# Patient Record
Sex: Male | Born: 1965 | Race: White | Hispanic: No | Marital: Single | Smoking: Current every day smoker
Health system: Southern US, Community
[De-identification: ages and names within clinical notes are randomized; demographics above are authoritative.]

## PROBLEM LIST (undated history)

## (undated) DIAGNOSIS — I10 Essential (primary) hypertension: Secondary | ICD-10-CM

## (undated) DIAGNOSIS — M1990 Unspecified osteoarthritis, unspecified site: Secondary | ICD-10-CM

## (undated) DIAGNOSIS — K259 Gastric ulcer, unspecified as acute or chronic, without hemorrhage or perforation: Secondary | ICD-10-CM

## (undated) DIAGNOSIS — R51 Headache: Secondary | ICD-10-CM

## (undated) DIAGNOSIS — R519 Headache, unspecified: Secondary | ICD-10-CM

## (undated) DIAGNOSIS — K219 Gastro-esophageal reflux disease without esophagitis: Secondary | ICD-10-CM

## (undated) DIAGNOSIS — F32A Depression, unspecified: Secondary | ICD-10-CM

## (undated) DIAGNOSIS — G4730 Sleep apnea, unspecified: Secondary | ICD-10-CM

## (undated) DIAGNOSIS — E785 Hyperlipidemia, unspecified: Secondary | ICD-10-CM

## (undated) DIAGNOSIS — T148XXA Other injury of unspecified body region, initial encounter: Secondary | ICD-10-CM

## (undated) DIAGNOSIS — S129XXA Fracture of neck, unspecified, initial encounter: Secondary | ICD-10-CM

## (undated) DIAGNOSIS — M109 Gout, unspecified: Secondary | ICD-10-CM

## (undated) DIAGNOSIS — F329 Major depressive disorder, single episode, unspecified: Secondary | ICD-10-CM

## (undated) DIAGNOSIS — J449 Chronic obstructive pulmonary disease, unspecified: Secondary | ICD-10-CM

## (undated) DIAGNOSIS — L409 Psoriasis, unspecified: Secondary | ICD-10-CM

## (undated) HISTORY — PX: ABDOMINAL SURGERY: SHX537

## (undated) HISTORY — PX: HIP SURGERY: SHX245

## (undated) HISTORY — PX: INCISE AND DRAIN ABCESS: PRO64

## (undated) HISTORY — PX: FOOT AMPUTATION THROUGH ANKLE: SHX643

## (undated) HISTORY — PX: COLONOSCOPY W/ POLYPECTOMY: SHX1380

## (undated) HISTORY — PX: LUMBAR DISC SURGERY: SHX700

## (undated) HISTORY — PX: BACK SURGERY: SHX140

---

## 2003-02-25 ENCOUNTER — Encounter: Payer: Self-pay | Admitting: Emergency Medicine

## 2003-02-25 ENCOUNTER — Inpatient Hospital Stay (HOSPITAL_COMMUNITY): Admission: EM | Admit: 2003-02-25 | Discharge: 2003-02-26 | Payer: Self-pay | Admitting: Emergency Medicine

## 2003-11-22 ENCOUNTER — Inpatient Hospital Stay (HOSPITAL_COMMUNITY): Admission: EM | Admit: 2003-11-22 | Discharge: 2003-11-24 | Payer: Self-pay | Admitting: Emergency Medicine

## 2004-07-30 ENCOUNTER — Emergency Department (HOSPITAL_COMMUNITY): Admission: EM | Admit: 2004-07-30 | Discharge: 2004-07-30 | Payer: Self-pay | Admitting: Emergency Medicine

## 2004-08-03 ENCOUNTER — Ambulatory Visit (HOSPITAL_COMMUNITY): Admission: RE | Admit: 2004-08-03 | Discharge: 2004-08-03 | Payer: Self-pay | Admitting: Emergency Medicine

## 2004-08-03 ENCOUNTER — Encounter: Payer: Self-pay | Admitting: Family Medicine

## 2004-08-05 ENCOUNTER — Emergency Department: Payer: Self-pay | Admitting: General Practice

## 2004-08-23 ENCOUNTER — Ambulatory Visit: Payer: Self-pay | Admitting: Orthopedic Surgery

## 2004-09-24 ENCOUNTER — Inpatient Hospital Stay (HOSPITAL_COMMUNITY): Admission: RE | Admit: 2004-09-24 | Discharge: 2004-09-25 | Payer: Self-pay | Admitting: Neurosurgery

## 2006-01-12 ENCOUNTER — Inpatient Hospital Stay (HOSPITAL_COMMUNITY): Admission: EM | Admit: 2006-01-12 | Discharge: 2006-01-14 | Payer: Self-pay | Admitting: Emergency Medicine

## 2006-01-13 ENCOUNTER — Ambulatory Visit: Payer: Self-pay | Admitting: Internal Medicine

## 2006-01-22 ENCOUNTER — Ambulatory Visit: Payer: Self-pay | Admitting: Internal Medicine

## 2006-09-17 ENCOUNTER — Ambulatory Visit: Payer: Self-pay | Admitting: Gastroenterology

## 2006-10-14 ENCOUNTER — Emergency Department (HOSPITAL_COMMUNITY): Admission: EM | Admit: 2006-10-14 | Discharge: 2006-10-14 | Payer: Self-pay | Admitting: Emergency Medicine

## 2006-12-25 ENCOUNTER — Ambulatory Visit: Payer: Self-pay | Admitting: Family Medicine

## 2006-12-25 DIAGNOSIS — M109 Gout, unspecified: Secondary | ICD-10-CM

## 2006-12-25 DIAGNOSIS — F17200 Nicotine dependence, unspecified, uncomplicated: Secondary | ICD-10-CM | POA: Insufficient documentation

## 2006-12-25 DIAGNOSIS — M549 Dorsalgia, unspecified: Secondary | ICD-10-CM | POA: Insufficient documentation

## 2006-12-25 DIAGNOSIS — M1990 Unspecified osteoarthritis, unspecified site: Secondary | ICD-10-CM

## 2006-12-25 DIAGNOSIS — K219 Gastro-esophageal reflux disease without esophagitis: Secondary | ICD-10-CM | POA: Insufficient documentation

## 2006-12-25 DIAGNOSIS — J309 Allergic rhinitis, unspecified: Secondary | ICD-10-CM | POA: Insufficient documentation

## 2006-12-25 DIAGNOSIS — K279 Peptic ulcer, site unspecified, unspecified as acute or chronic, without hemorrhage or perforation: Secondary | ICD-10-CM | POA: Insufficient documentation

## 2006-12-26 ENCOUNTER — Encounter: Payer: Self-pay | Admitting: Family Medicine

## 2007-01-01 ENCOUNTER — Encounter: Payer: Self-pay | Admitting: Family Medicine

## 2007-01-02 ENCOUNTER — Ambulatory Visit: Payer: Self-pay | Admitting: Family Medicine

## 2007-01-05 LAB — CONVERTED CEMR LAB
ALT: 40 units/L (ref 0–40)
AST: 33 units/L (ref 0–37)
Albumin: 4 g/dL (ref 3.5–5.2)
Alkaline Phosphatase: 86 units/L (ref 39–117)
BUN: 7 mg/dL (ref 6–23)
Bilirubin, Direct: 0.1 mg/dL (ref 0.0–0.3)
CO2: 30 meq/L (ref 19–32)
Calcium: 9.6 mg/dL (ref 8.4–10.5)
Chloride: 105 meq/L (ref 96–112)
Cholesterol: 207 mg/dL (ref 0–200)
Creatinine, Ser: 1 mg/dL (ref 0.4–1.5)
Direct LDL: 91.8 mg/dL
GFR calc Af Amer: 105 mL/min
GFR calc non Af Amer: 87 mL/min
Glucose, Bld: 96 mg/dL (ref 70–99)
HDL: 54 mg/dL (ref >39.0)
Potassium: 4.3 meq/L (ref 3.5–5.1)
Sodium: 143 meq/L (ref 135–145)
Total Bilirubin: 0.6 mg/dL (ref 0.3–1.2)
Total CHOL/HDL Ratio: 3.8
Total Protein: 7.2 g/dL (ref 6.0–8.3)
Triglycerides: 404 mg/dL (ref 0–149)
VLDL: 81 mg/dL — ABNORMAL HIGH (ref 0–40)

## 2007-01-21 ENCOUNTER — Encounter: Payer: Self-pay | Admitting: Family Medicine

## 2007-03-24 ENCOUNTER — Ambulatory Visit: Payer: Self-pay | Admitting: Family Medicine

## 2007-03-30 ENCOUNTER — Encounter: Payer: Self-pay | Admitting: Family Medicine

## 2007-03-30 ENCOUNTER — Ambulatory Visit: Payer: Self-pay | Admitting: Family Medicine

## 2007-04-15 ENCOUNTER — Encounter: Payer: Self-pay | Admitting: Family Medicine

## 2007-05-12 ENCOUNTER — Encounter: Payer: Self-pay | Admitting: Family Medicine

## 2007-06-02 ENCOUNTER — Encounter: Payer: Self-pay | Admitting: Family Medicine

## 2008-03-03 ENCOUNTER — Emergency Department (HOSPITAL_COMMUNITY): Admission: EM | Admit: 2008-03-03 | Discharge: 2008-03-03 | Payer: Self-pay | Admitting: Emergency Medicine

## 2009-02-22 ENCOUNTER — Emergency Department: Payer: Self-pay | Admitting: Internal Medicine

## 2009-02-23 ENCOUNTER — Emergency Department: Payer: Self-pay | Admitting: Unknown Physician Specialty

## 2009-02-24 ENCOUNTER — Encounter (INDEPENDENT_AMBULATORY_CARE_PROVIDER_SITE_OTHER): Payer: Self-pay | Admitting: Orthopedic Surgery

## 2009-02-24 ENCOUNTER — Inpatient Hospital Stay (HOSPITAL_COMMUNITY): Admission: EM | Admit: 2009-02-24 | Discharge: 2009-02-28 | Payer: Self-pay | Admitting: Emergency Medicine

## 2009-11-20 ENCOUNTER — Emergency Department (HOSPITAL_COMMUNITY): Admission: EM | Admit: 2009-11-20 | Discharge: 2009-11-20 | Payer: Self-pay | Admitting: Emergency Medicine

## 2009-12-13 ENCOUNTER — Ambulatory Visit: Payer: Self-pay | Admitting: Internal Medicine

## 2009-12-13 DIAGNOSIS — R634 Abnormal weight loss: Secondary | ICD-10-CM | POA: Insufficient documentation

## 2009-12-13 DIAGNOSIS — R1013 Epigastric pain: Secondary | ICD-10-CM

## 2009-12-13 DIAGNOSIS — K210 Gastro-esophageal reflux disease with esophagitis: Secondary | ICD-10-CM

## 2009-12-13 DIAGNOSIS — Z8711 Personal history of peptic ulcer disease: Secondary | ICD-10-CM

## 2009-12-25 ENCOUNTER — Encounter (INDEPENDENT_AMBULATORY_CARE_PROVIDER_SITE_OTHER): Payer: Self-pay | Admitting: *Deleted
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
## 2010-09-06 NOTE — Letter (Signed)
Summary: Recall Radiology  [facility]  [address]   [place], [place] [address]   Phone: [phone]  Fax: [phone]    Dec 25, 2009  [name] [address] [place], [place]  [address] [birth year]   Dear [name],   Our office needs to get you scheduled for your procedure. Please give our office a call to schedule this.  You may call the office at your convenience at [phone].  Please ask for the Referral Coordinator to make arrangements for this to be scheduled.  You may have to leave a message on our voice mail.  We will return your call.  If for any reason you do not wish to schedule this, please advise the office.  Please do not neglect your health.   Thank you,    Ave Filter  [facility] Ph: [phone]   Fax: [phone]    Appended Document: Recall Radiology Letter returned, no valid address.

## 2010-10-23 LAB — DIFFERENTIAL
Basophils Absolute: 0 10*3/uL (ref 0.0–0.1)
Basophils Relative: 1 % (ref 0–1)
Eosinophils Absolute: 0.5 10*3/uL (ref 0.0–0.7)
Eosinophils Relative: 6 % — ABNORMAL HIGH (ref 0–5)
Lymphocytes Relative: 26 % (ref 12–46)
Lymphs Abs: 2.1 10*3/uL (ref 0.7–4.0)
Monocytes Absolute: 0.6 10*3/uL (ref 0.1–1.0)
Monocytes Relative: 8 % (ref 3–12)
Neutro Abs: 4.9 10*3/uL (ref 1.7–7.7)
Neutrophils Relative %: 60 % (ref 43–77)

## 2010-10-23 LAB — BASIC METABOLIC PANEL
BUN: 3 mg/dL — ABNORMAL LOW (ref 6–23)
CO2: 29 mEq/L (ref 19–32)
Calcium: 9.1 mg/dL (ref 8.4–10.5)
Chloride: 98 mEq/L (ref 96–112)
Creatinine, Ser: 0.81 mg/dL (ref 0.4–1.5)
GFR calc Af Amer: >60 mL/min (ref >60)
GFR calc non Af Amer: >60 mL/min (ref >60)
Glucose, Bld: 77 mg/dL (ref 70–99)
Potassium: 3.1 mEq/L — ABNORMAL LOW (ref 3.5–5.1)
Sodium: 134 mEq/L — ABNORMAL LOW (ref 135–145)

## 2010-10-23 LAB — CBC
HCT: 44.7 % (ref 39.0–52.0)
Hemoglobin: 15.5 g/dL (ref 13.0–17.0)
MCHC: 34.6 g/dL (ref 30.0–36.0)
MCV: 91.4 fL (ref 78.0–100.0)
Platelets: 281 10*3/uL (ref 150–400)
RBC: 4.88 MIL/uL (ref 4.22–5.81)
RDW: 13.1 % (ref 11.5–15.5)
WBC: 8.1 10*3/uL (ref 4.0–10.5)

## 2010-10-23 LAB — URIC ACID: Uric Acid, Serum: 6.9 mg/dL (ref 4.0–7.8)

## 2010-11-11 LAB — DIFFERENTIAL
Basophils Absolute: 0 10*3/uL (ref 0.0–0.1)
Basophils Relative: 0 % (ref 0–1)
Eosinophils Absolute: 0 10*3/uL (ref 0.0–0.7)
Eosinophils Absolute: 0.5 10*3/uL (ref 0.0–0.7)
Eosinophils Relative: 6 % — ABNORMAL HIGH (ref 0–5)
Lymphocytes Relative: 12 % (ref 12–46)
Lymphocytes Relative: 20 % (ref 12–46)
Lymphs Abs: 1 10*3/uL (ref 0.7–4.0)
Lymphs Abs: 1.6 10*3/uL (ref 0.7–4.0)
Monocytes Absolute: 0.8 10*3/uL (ref 0.1–1.0)
Monocytes Relative: 11 % (ref 3–12)
Monocytes Relative: 6 % (ref 3–12)
Neutro Abs: 4.9 10*3/uL (ref 1.7–7.7)
Neutrophils Relative %: 63 % (ref 43–77)
Neutrophils Relative %: 82 % — ABNORMAL HIGH (ref 43–77)

## 2010-11-11 LAB — CBC
HCT: 36.9 % — ABNORMAL LOW (ref 39.0–52.0)
HCT: 42.5 % (ref 39.0–52.0)
Hemoglobin: 14.5 g/dL (ref 13.0–17.0)
MCHC: 34.2 g/dL (ref 30.0–36.0)
MCV: 91 fL (ref 78.0–100.0)
MCV: 91.7 fL (ref 78.0–100.0)
Platelets: 244 10*3/uL (ref 150–400)
Platelets: 265 10*3/uL (ref 150–400)
RBC: 4.06 MIL/uL — ABNORMAL LOW (ref 4.22–5.81)
RBC: 4.63 MIL/uL (ref 4.22–5.81)
RDW: 13 % (ref 11.5–15.5)
WBC: 7.8 10*3/uL (ref 4.0–10.5)
WBC: 8.6 10*3/uL (ref 4.0–10.5)

## 2010-11-11 LAB — POCT I-STAT, CHEM 8
Calcium, Ion: 1.15 mmol/L (ref 1.12–1.32)
Chloride: 103 mEq/L (ref 96–112)
HCT: 45 % (ref 39.0–52.0)
Hemoglobin: 15.3 g/dL (ref 13.0–17.0)
TCO2: 25 mmol/L (ref 0–100)

## 2010-11-11 LAB — BASIC METABOLIC PANEL
BUN: 6 mg/dL (ref 6–23)
Calcium: 9.7 mg/dL (ref 8.4–10.5)
Chloride: 102 mEq/L (ref 96–112)
Creatinine, Ser: 0.81 mg/dL (ref 0.4–1.5)
Creatinine, Ser: 0.9 mg/dL (ref 0.4–1.5)
GFR calc Af Amer: >60 mL/min (ref >60)
GFR calc Af Amer: >60 mL/min (ref >60)
GFR calc non Af Amer: >60 mL/min (ref >60)
GFR calc non Af Amer: >60 mL/min (ref >60)
Potassium: 4.2 mEq/L (ref 3.5–5.1)

## 2010-11-11 LAB — ANAEROBIC CULTURE

## 2010-11-11 LAB — TISSUE CULTURE

## 2010-11-11 LAB — URIC ACID: Uric Acid, Serum: 7.4 mg/dL (ref 4.0–7.8)

## 2010-11-11 LAB — WOUND CULTURE

## 2010-11-11 LAB — GENTAMICIN LEVEL, TROUGH: Gentamicin Trough: 2.2 ug/mL (ref 0.5–2.0)

## 2010-12-18 NOTE — Op Note (Signed)
[name], [name]                 ACCOUNT NO.:  [id number]   MEDICAL RECORD NO.:  [id number]          PATIENT TYPE:  INP   LOCATION:  5032                         FACILITY:  [facility]   PHYSICIAN:  Dionne Ano. Gramig, M.D.DATE OF BIRTH:  [birth year]   DATE OF PROCEDURE:  02/26/2009  DATE OF DISCHARGE:                               OPERATIVE REPORT   PREOPERATIVE DIAGNOSIS:  Status post incision and drainage, deep  abscess, right elbow.  The patient presents for repeat incision and  drainage, possible loose closure.   POSTOPERATIVE DIAGNOSIS:  Status post incision and drainage, deep  abscess, right elbow.  The patient presents for repeat incision and  drainage, possible loose closure.   PROCEDURE:  Incision and drainage of skin, subcutaneous tissue, bone,  periosteum, and tendon, right elbow and meticulous complex closure over  a Hemovac drain, right elbow.   SURGEON:  Dionne Ano. Amanda Pea, MD   ASSISTANT:  None.   COMPLICATIONS:  None.   ANESTHESIA:  General.   TOURNIQUET TIME:  Zero.   INDICATIONS FOR PROCEDURE:  This patient is a 45 year old male who  presents with the above-mentioned diagnosis.  He was initially drained  and following the drainage 48 hours ago, the patient has improved  significantly in terms of resolving erythema.  He presents for repeat I  and D and possible wound closure.   OPERATIVE PROCEDURE:  The patient was seen by myself and the Anesthesia,  taken to the operative suite, time-out was called, preop checklist  accomplished.  He was prepped and draped in the usual sterile fashion.  Betadine scrub and paint about the affected right upper extremity.  Time-  out was then called once again and the operation commenced with I&D of  skin, subcutaneous tissue, bone, tendon, and periosteal tissue.  The  wound conditions were much improved.  No reaccumulation of fluid or pus  irrigated copiously and following this, I made a secondary survey to  make sure there was  no devitalized tissue.  Sharp knife dissection was  used for debridement purposes.  Following this, 3 L were placed through  the wound.  Once this done, Hemovac drain was placed and the wound was  then closed in a complex fashion.  This complex wound closure was about  the elbow.  I left proximal and distal openings to allow for the  egression of fluid.  Following this, compressive wrap was placed.  Topical bacitracin cream was placed and wound was dressed nicely.  Hemovac was hooked up for suction.  We will continue drainage,  close observation, and plan to have the patient continue in inpatient  status until his wound cultures are final.  He is currently on  vancomycin and gentamicin and we will continue him on these antibiotics  until cultures are final.  He tolerated the procedure well.  A splint  was placed.  He was taken to the recovery room in stable condition.      Dionne Ano. Amanda Pea, M.D.  Electronically Signed     Dionne Ano. Amanda Pea, M.D.  Electronically Signed    WMG/MEDQ  D:  02/26/2009  T:  02/26/2009  Job:  [id number]
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
## 2010-12-21 ENCOUNTER — Emergency Department: Payer: Self-pay | Admitting: Emergency Medicine

## 2010-12-21 NOTE — Op Note (Signed)
NAMETREVAUGHN, SCHEAR                 ACCOUNT NO.:  [id number]   MEDICAL RECORD NO.:  [id number]          PATIENT TYPE:  INP   LOCATION:  IC03                          FACILITY:  [facility]   PHYSICIAN:  R. Roetta Sessions, M.D. DATE OF BIRTH:  12/27/1965   DATE OF PROCEDURE:  01/13/2006  DATE OF DISCHARGE:                                 OPERATIVE REPORT   EGD bleeding control therapy.   INDICATIONS FOR PROCEDURE:  The patient is a 45 year old gentleman admitted  to the hospital with upper GI bleeding and he has remained hemodynamically  stable.  His hemoglobin has narrowed down to 10.9 without transfusion thus  far.  He does take aspirins in multiple forms including Alka-Seltzer.  EGD  is now being done.  This approach has been discussed with patient at length.  Potential risks, benefits and alternatives have been reviewed and questions  answered, agreeable.  NG tube was pulled out prior to procedure.  Cetacaine  Spray with topical oropharyngeal anesthesia.   CONSCIOUS SEDATION:  1.  Versed 5 mg IV.  2.  Demerol 125 mg IV in divided doses.   FINDINGS:  Examination of tubular esophagus revealed 3 cm four quadrant  erosions coming up from the EG junction.  He had a patulous EG junction.  There was no Barrett's, no active bleeding from the esophagus.  EG junction  easily traversed.  The remaining stomach, colon and gastric cavity was  emptied and insufflated well with air.  Thorough examination of gastric  mucosa including retroflexion of the proximal stomach and esophagogastric  junction demonstrated a small hiatal hernia and punctate hemorrhages,  erosions along the greater curvature consistent with NG tube trauma.  There  was a 6 mm prepyloric antral ulcer with a visible vessel present.  Please  see photos.  Pylorus was patent and easily traversed.  Examination of the  bulb and second portion revealed nodular duodenitis with erosions and edema  primarily involving the bulb, the second  portion appeared normal.   THERAPEUTIC DIAGNOSTIC MANEUVERS PERFORMED:  The bulbar ulcer of the visible  vessel was sealed with several applications of the Gold probe at 20 joules  each.  This was done without difficulty or apparent complication.  The  patient tolerated the procedure well and was reactive to endoscopy.   IMPRESSION:  1.  Moderately severe erosive reflux esophagitis with patulous      esophagogastric junction and small hiatal hernia, nasogastric tube      trauma as outlined above.  2.  Prepyloric gastric ulcer with bleeding stigmata thermally sealed as      described above, otherwise normal stomach.  3.  Marked inflammatory changes of the duodenal bulb, but no ulcer;      otherwise D1, D2 appeared normal.   RECOMMENDATIONS:  1.  Leave NG tube out, no nonsteroidals from here on out.  2.  Continue b.i.d. proton pump inhibitor therapy.  3.  Check H. pylori serologies.  4.  Advance to a clear liquid diet.      Jonathon Bellows, M.D.  Electronically Signed  RMR/MEDQ  D:  01/13/2006  T:  01/13/2006  Job:  [id number]   cc:   [employer] Hospitalist Team A
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
## 2010-12-21 NOTE — Procedures (Signed)
NAMESAHIB, PELLA                           ACCOUNT NO.:  [id number]   MEDICAL RECORD NO.:  [id number]                   PATIENT TYPE:  INP   LOCATION:  A203                                 FACILITY:  [facility]   PHYSICIAN:  Richard A. Alanda Amass, M.D.          DATE OF BIRTH:  1966-01-31   DATE OF PROCEDURE:  07/24/2004  DATE OF DISCHARGE:                                  ECHOCARDIOGRAM   INDICATION:  This 45 year old gentleman has a history of left ventricular  hypertrophy, hypertension, and several months of chest pain syndrome, and  possible cardiac murmur.   FINDINGS:  1. The aorta is normal at 3.6 cm.  2. The aortic valve has three leaflets with normal opening.  There is no AS     or AI.  3. The left atrium is normal at 3.5 cm.  4. The patient was in sinus rhythm during the study.  There were no clots     seen.  5. IVS and LVPW are mildly concentrically thickened to 1.3 and 1.2 cm     respectively.  This is compatible with mild concentric hypertrophy.     There is no outflow tract gradient, and there is normal septal and     posterior wall contraction pattern.  6. The left ventricular internal dimensions are within normal limits.  7. LVIDD equaled 4.2, LVISD equaled 3.0.  There is normal thickening of all     visualized myocardial segments with estimated ejection fraction of     approximately 60%.  8. LV inflow signal was normal with no evidence of diastolic dysfunction.  9. The right ventricle is slightly enlarged, but there is normal systolic     contraction.  There is no pericardial fusion, IVC collapses on     inspiration.  10.      The mitral valve opens normally.  There is mild TMR present.  No     mitral valve prolapse.  11.      There is trivial tricuspid regurgitation.  12.      The 2-D echocardiogram demonstrates normal systolic function.  No     evidence of diastolic relaxation abnormality.  13.      No significant valvular abnormality.  14.      Mild to moderate  concentric left ventricular hypertrophy is seen.      ___________________________________________                                            Pearletha Furl Alanda Amass, M.D.   RAW/MEDQ  D:  11/23/2003  T:  11/23/2003  Job:  [id number]   cc:   Hanley Hays. Dechurch, M.D.  [address]  [place]  [place] [address]  Fax: [phone]
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
## 2010-12-21 NOTE — Op Note (Signed)
NAMEDEIONTE, SPIVACK                 ACCOUNT NO.:  [id number]   MEDICAL RECORD NO.:  [id number]          PATIENT TYPE:  INP   LOCATION:  3030                         FACILITY:  [facility]   PHYSICIAN:  Donalee Citrin, M.D.        DATE OF BIRTH:  1966/04/07   DATE OF PROCEDURE:  09/24/2004  DATE OF DISCHARGE:                                 OPERATIVE REPORT   PREOPERATIVE DIAGNOSIS:  L5 radiculopathy from a large ruptured disc L4-L5,  left.   POSTOPERATIVE DIAGNOSIS:  L5 radiculopathy from a large ruptured disc L4-L5,  left.   PROCEDURE:  Lumbar laminectomy and microdiscectomy at L4-L5 on the left with  microscope dissection of the left L5 nerve root.   SURGEON:  Donalee Citrin, M.D.   ASSISTANT:  Kathaleen Maser. Pool, M.D.   ANESTHESIA:  General endotracheal anesthesia.   HISTORY OF PRESENT ILLNESS:  The patient is a very pleasant 45 year old  gentleman who has had long-standing back and left hip and leg pain going on  over the last several weeks and months.  It has been getting progressively  worse and the patient has failed all forms of conservative treatment.  Preoperative imaging showed a very large ruptured disc with free fragment  composites, severe spinal stenosis, and the patient's preoperative exam was  somewhat limited due to the patient has had a fused left ankle.  The right  leg was 5/5 strength.  Due to the large size of the disc fragments and  failure of conservative treatment, the patient was recommended laminectomy  and microdiscectomy.  I discussed the risks and benefits of the surgery with  him, he understood and agreed to proceed forth.   DESCRIPTION OF PROCEDURE:  The patient was brought to the operating room,  induced under general anesthesia, positioned prone on the Wilson frame.  The  back was prepped and draped in the usual sterile fashion.  After a preop x-  ray localized the L4-L5 disc space, a midline incision was made after  infiltration of 10 mL of lidocaine with  epinephrine, and bovie  electrocautery was used to dissect through the subcutaneous tissue and  subperiosteal dissection was carried out at the lamina of L4 and L5  confirmed by x-ray.  Then, the medial aspect of the facet complex and  inferior window of L4 was drilled down with a high speed drill and then 3 mm  Kerrison punches were used to remove the inferior aspect of L4 and medial  facet complex exposing the ligamentum flavum which was removed in a  piecemeal fashion exposing the thecal sac.  The ligament was again taken out  laterally under biting the lateral medial facet and gutter.  Then, the  operating microscope was draped and brought onto the field.  The L5 nerve  root was identified and L5 neural foramen was unroofed.  The L5 neural  foramen was noted to be markedly compressed from large fragment disc  displacing it dorsally.  Using a microdissection technique with a blunt  nerve hook, the L5 nerve root was mobilized, reflected medially.  An  annulotomy was made and several large fragments as well as a very large free  fragment was removed from the superior aspect of the disc space under the  thecal sac.  After all the free fragments were removed, the disc was cleaned  out with pituitary rongeurs and downgoing Epstein curets.  At the end of the  discectomy, there was no further stenosis of the thecal sac or the L5 nerve  roots, explored with a coronary dilator, angled hockey stick, and blunt  nerve hook and noted to have no further fragments appreciated.  Then, the  wound was copiously irrigated and meticulous hemostasis was maintained.  Gelfoam was overlaid on top of the dura.  The muscle and fascia were  reapproximated with 0 interrupted Vicryl, the  subcutaneous tissue was closed with 2-0 interrupted Vicryl, and skin was  closed with running 4-0 subcuticular.  Benzoin and Steri-Strips were  applied.  The patient went to the recovery room in stable condition.  At the  end of  the case, the needle, sponge, and instrument counts were correct.      GC/MEDQ  D:  09/24/2004  T:  09/24/2004  Job:  [id number]
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
## 2012-07-21 ENCOUNTER — Observation Stay (HOSPITAL_COMMUNITY)
Admission: EM | Admit: 2012-07-21 | Discharge: 2012-07-23 | Disposition: A | Discharge status: Home / Self Care | Payer: Medicare Other | Attending: Orthopedic Surgery | Admitting: Orthopedic Surgery

## 2012-07-21 ENCOUNTER — Emergency Department (HOSPITAL_COMMUNITY): Payer: Medicare Other

## 2012-07-21 ENCOUNTER — Encounter (HOSPITAL_COMMUNITY): Payer: Self-pay | Admitting: *Deleted

## 2012-07-21 DIAGNOSIS — R1013 Epigastric pain: Secondary | ICD-10-CM

## 2012-07-21 DIAGNOSIS — Z23 Encounter for immunization: Secondary | ICD-10-CM | POA: Insufficient documentation

## 2012-07-21 DIAGNOSIS — J309 Allergic rhinitis, unspecified: Secondary | ICD-10-CM

## 2012-07-21 DIAGNOSIS — M009 Pyogenic arthritis, unspecified: Secondary | ICD-10-CM

## 2012-07-21 DIAGNOSIS — I10 Essential (primary) hypertension: Secondary | ICD-10-CM | POA: Diagnosis present

## 2012-07-21 DIAGNOSIS — L03119 Cellulitis of unspecified part of limb: Secondary | ICD-10-CM | POA: Diagnosis present

## 2012-07-21 DIAGNOSIS — F1010 Alcohol abuse, uncomplicated: Secondary | ICD-10-CM | POA: Diagnosis present

## 2012-07-21 DIAGNOSIS — IMO0002 Reserved for concepts with insufficient information to code with codable children: Secondary | ICD-10-CM | POA: Insufficient documentation

## 2012-07-21 DIAGNOSIS — M25539 Pain in unspecified wrist: Secondary | ICD-10-CM | POA: Insufficient documentation

## 2012-07-21 DIAGNOSIS — F17200 Nicotine dependence, unspecified, uncomplicated: Secondary | ICD-10-CM | POA: Diagnosis present

## 2012-07-21 DIAGNOSIS — K279 Peptic ulcer, site unspecified, unspecified as acute or chronic, without hemorrhage or perforation: Secondary | ICD-10-CM | POA: Diagnosis present

## 2012-07-21 DIAGNOSIS — K219 Gastro-esophageal reflux disease without esophagitis: Secondary | ICD-10-CM | POA: Diagnosis present

## 2012-07-21 DIAGNOSIS — M109 Gout, unspecified: Principal | ICD-10-CM | POA: Diagnosis present

## 2012-07-21 HISTORY — DX: Gastric ulcer, unspecified as acute or chronic, without hemorrhage or perforation: K25.9

## 2012-07-21 HISTORY — DX: Essential (primary) hypertension: I10

## 2012-07-21 HISTORY — DX: Fracture of neck, unspecified, initial encounter: S12.9XXA

## 2012-07-21 HISTORY — DX: Hyperlipidemia, unspecified: E78.5

## 2012-07-21 HISTORY — DX: Headache: R51

## 2012-07-21 HISTORY — DX: Sleep apnea, unspecified: G47.30

## 2012-07-21 HISTORY — DX: Gout, unspecified: M10.9

## 2012-07-21 HISTORY — DX: Psoriasis, unspecified: L40.9

## 2012-07-21 HISTORY — DX: Unspecified osteoarthritis, unspecified site: M19.90

## 2012-07-21 HISTORY — DX: Gastro-esophageal reflux disease without esophagitis: K21.9

## 2012-07-21 HISTORY — DX: Headache, unspecified: R51.9

## 2012-07-21 LAB — CBC WITH DIFFERENTIAL/PLATELET
Basophils Relative: 1 % (ref 0–1)
HCT: 43.5 % (ref 39.0–52.0)
Hemoglobin: 15.4 g/dL (ref 13.0–17.0)
Lymphocytes Relative: 19 % (ref 12–46)
Lymphs Abs: 1.9 10*3/uL (ref 0.7–4.0)
MCHC: 35.4 g/dL (ref 30.0–36.0)
Monocytes Absolute: 1.2 10*3/uL — ABNORMAL HIGH (ref 0.1–1.0)
Monocytes Relative: 12 % (ref 3–12)
Neutro Abs: 6.5 10*3/uL (ref 1.7–7.7)
Neutrophils Relative %: 65 % (ref 43–77)
RBC: 4.9 MIL/uL (ref 4.22–5.81)

## 2012-07-21 LAB — COMPREHENSIVE METABOLIC PANEL
Albumin: 3.8 g/dL (ref 3.5–5.2)
Alkaline Phosphatase: 130 U/L — ABNORMAL HIGH (ref 39–117)
BUN: 7 mg/dL (ref 6–23)
CO2: 22 mEq/L (ref 19–32)
Chloride: 97 mEq/L (ref 96–112)
Creatinine, Ser: 0.82 mg/dL (ref 0.50–1.35)
GFR calc non Af Amer: >90 mL/min (ref >90)
Glucose, Bld: 125 mg/dL — ABNORMAL HIGH (ref 70–99)
Potassium: 3.8 mEq/L (ref 3.5–5.1)
Total Bilirubin: 1 mg/dL (ref 0.3–1.2)

## 2012-07-21 LAB — URIC ACID: Uric Acid, Serum: 11.7 mg/dL — ABNORMAL HIGH (ref 4.0–7.8)

## 2012-07-21 MED ORDER — INFLUENZA VIRUS VACC SPLIT PF IM SUSP
0.5000 mL | INTRAMUSCULAR | Status: AC
  Administered 2012-07-22: 0.5 mL via INTRAMUSCULAR
  Filled 2012-07-21: qty 0.5

## 2012-07-21 MED ORDER — MORPHINE SULFATE 4 MG/ML IJ SOLN
4.0000 mg | Freq: Once | INTRAMUSCULAR | Status: AC
  Administered 2012-07-21: 4 mg via INTRAVENOUS
  Filled 2012-07-21: qty 1

## 2012-07-21 MED ORDER — THIAMINE HCL 100 MG/ML IJ SOLN
100.0000 mg | Freq: Every day | INTRAMUSCULAR | Status: DC
  Administered 2012-07-21: 100 mg via INTRAVENOUS
  Filled 2012-07-21 (×3): qty 1

## 2012-07-21 MED ORDER — ADULT MULTIVITAMIN W/MINERALS CH
1.0000 | ORAL_TABLET | Freq: Every day | ORAL | Status: DC
  Administered 2012-07-21 – 2012-07-23 (×3): 1 via ORAL
  Filled 2012-07-21 (×3): qty 1

## 2012-07-21 MED ORDER — VITAMIN B-1 100 MG PO TABS
100.0000 mg | ORAL_TABLET | Freq: Every day | ORAL | Status: DC
  Administered 2012-07-22 – 2012-07-23 (×2): 100 mg via ORAL
  Filled 2012-07-21 (×3): qty 1

## 2012-07-21 MED ORDER — MORPHINE SULFATE 2 MG/ML IJ SOLN
1.0000 mg | INTRAMUSCULAR | Status: DC | PRN
  Administered 2012-07-21 – 2012-07-22 (×6): 1 mg via INTRAVENOUS
  Filled 2012-07-21 (×5): qty 1

## 2012-07-21 MED ORDER — PANTOPRAZOLE SODIUM 40 MG PO TBEC
40.0000 mg | DELAYED_RELEASE_TABLET | Freq: Every day | ORAL | Status: DC
  Administered 2012-07-21 – 2012-07-23 (×3): 40 mg via ORAL
  Filled 2012-07-21 (×3): qty 1

## 2012-07-21 MED ORDER — PNEUMOCOCCAL VAC POLYVALENT 25 MCG/0.5ML IJ INJ
0.5000 mL | INJECTION | INTRAMUSCULAR | Status: AC
  Administered 2012-07-22: 0.5 mL via INTRAMUSCULAR
  Filled 2012-07-21: qty 0.5

## 2012-07-21 MED ORDER — LORAZEPAM 2 MG/ML IJ SOLN: 1.0000 mg | Freq: Four times a day (QID) | INTRAMUSCULAR | Status: DC | PRN

## 2012-07-21 MED ORDER — AMLODIPINE BESYLATE 5 MG PO TABS
5.0000 mg | ORAL_TABLET | Freq: Every day | ORAL | Status: DC
  Administered 2012-07-21 – 2012-07-23 (×3): 5 mg via ORAL
  Filled 2012-07-21 (×3): qty 1

## 2012-07-21 MED ORDER — VANCOMYCIN HCL IN DEXTROSE 1-5 GM/200ML-% IV SOLN
1000.0000 mg | Freq: Three times a day (TID) | INTRAVENOUS | Status: DC
  Administered 2012-07-21 – 2012-07-23 (×6): 1000 mg via INTRAVENOUS
  Filled 2012-07-21 (×8): qty 200

## 2012-07-21 MED ORDER — ONDANSETRON HCL 4 MG PO TABS: 4.0000 mg | ORAL_TABLET | Freq: Four times a day (QID) | ORAL | Status: DC | PRN

## 2012-07-21 MED ORDER — COLCHICINE 0.6 MG PO TABS
0.6000 mg | ORAL_TABLET | Freq: Three times a day (TID) | ORAL | Status: DC
  Administered 2012-07-21 – 2012-07-23 (×6): 0.6 mg via ORAL
  Filled 2012-07-21 (×8): qty 1

## 2012-07-21 MED ORDER — KETOROLAC TROMETHAMINE 30 MG/ML IJ SOLN
30.0000 mg | Freq: Once | INTRAMUSCULAR | Status: AC
  Administered 2012-07-21: 30 mg via INTRAVENOUS
  Filled 2012-07-21: qty 1

## 2012-07-21 MED ORDER — FOLIC ACID 1 MG PO TABS
1.0000 mg | ORAL_TABLET | Freq: Every day | ORAL | Status: DC
  Administered 2012-07-21 – 2012-07-23 (×3): 1 mg via ORAL
  Filled 2012-07-21 (×3): qty 1

## 2012-07-21 MED ORDER — SODIUM CHLORIDE 0.9 % IV BOLUS (SEPSIS)
1000.0000 mL | Freq: Once | INTRAVENOUS | Status: AC
  Administered 2012-07-21: 1000 mL via INTRAVENOUS

## 2012-07-21 MED ORDER — SODIUM CHLORIDE 0.45 % IV SOLN
INTRAVENOUS | Status: DC
  Administered 2012-07-21 – 2012-07-23 (×5): via INTRAVENOUS

## 2012-07-21 MED ORDER — ONDANSETRON HCL 4 MG/2ML IJ SOLN
4.0000 mg | Freq: Four times a day (QID) | INTRAMUSCULAR | Status: DC | PRN
  Filled 2012-07-21: qty 2

## 2012-07-21 MED ORDER — LORAZEPAM 1 MG PO TABS
1.0000 mg | ORAL_TABLET | Freq: Four times a day (QID) | ORAL | Status: DC | PRN
Start: 2012-07-21 — End: 2012-07-23
  Administered 2012-07-22 – 2012-07-23 (×2): 1 mg via ORAL
  Filled 2012-07-21 (×2): qty 1

## 2012-07-21 NOTE — Progress Notes (Signed)
Patient ID: [name], male   DOB: 1965/10/12, 46 y.o.   MRN: [id number] Seen and examined  Will watch closely and begin colchicine No definable abscess but will watch for declaration/worsening Dominica Severin MD

## 2012-07-21 NOTE — H&P (Signed)
[name] is an 46 y.o. male.   Chief Complaint: Right elbow and wrist pain HPI: The patient is a 46 year old gentleman who presents to the hospital today for evaluation of his right upper extremity. He states that he's had approximately a one-week history of right elbow pain and now right wrist pain. He states this increased over the weekend and as of yesterday he noted significant swelling pain and difficulty bending the elbow and wrist. He denies any antecedent trauma. He has been seen and evaluated by our services in the past with a history of gout and olecranon bursitis, infectious in nature. He underwent a prior I&D in 2010 about the elbow, tophaceous deposits were noted at the time cultures did not show any significant growth. The patient states he did have an increased amount of alcohol use over the weekend and beginning this week had noted increase in symptoms about the elbow and wrist. He denies fevers, chills, shortness of breath. His had no treatment his current predicament.  He has been seen and evaluated in the past at the [facility]  for gout management, peptic ulcer disease, reflux, and back pain.  Past medical history: Gout, tobacco abuse, allergic rhinitis, hypertension untreated, reflux esophagitis, GERD, peptic ulcer disease, osteoarthritis, back pain, history of alcohol abuse (chart reviewed).    Past Surgical History  Procedure Date  . Hip surgery   . Abdominal surgery   . Elbow surgery     right  . Foot surgery   . Back surgery     No family history on file. Social History:  reports that he has been smoking.  He does not have any smokeless tobacco history on file. He reports that he drinks alcohol. He reports that he does not use illicit drugs.  Allergies:  Allergies  Allergen Reactions  . Penicillins     REACTION: Rash, itching     (Not in a hospital admission)  Results for orders placed during the hospital encounter of 07/21/12 (from the past 48  hour(s))  CBC WITH DIFFERENTIAL     Status: Abnormal   Collection Time   07/21/12  8:52 AM      Component Value Range Comment   WBC 10.0  4.0 - 10.5 K/uL    RBC 4.90  4.22 - 5.81 MIL/uL    Hemoglobin 15.4  13.0 - 17.0 g/dL    HCT 29.5  28.4 - 13.2 %    MCV 88.8  78.0 - 100.0 fL    MCH 31.4  26.0 - 34.0 pg    MCHC 35.4  30.0 - 36.0 g/dL    RDW 44.0  10.2 - 72.5 %    Platelets 197  150 - 400 K/uL    Neutrophils Relative 65  43 - 77 %    Neutro Abs 6.5  1.7 - 7.7 K/uL    Lymphocytes Relative 19  12 - 46 %    Lymphs Abs 1.9  0.7 - 4.0 K/uL    Monocytes Relative 12  3 - 12 %    Monocytes Absolute 1.2 (*) 0.1 - 1.0 K/uL    Eosinophils Relative 3  0 - 5 %    Eosinophils Absolute 0.3  0.0 - 0.7 K/uL    Basophils Relative 1  0 - 1 %    Basophils Absolute 0.1  0.0 - 0.1 K/uL   COMPREHENSIVE METABOLIC PANEL     Status: Abnormal   Collection Time   07/21/12  8:52 AM  Component Value Range Comment   Sodium 136  135 - 145 mEq/L    Potassium 3.8  3.5 - 5.1 mEq/L    Chloride 97  96 - 112 mEq/L    CO2 22  19 - 32 mEq/L    Glucose, Bld 125 (*) 70 - 99 mg/dL    BUN 7  6 - 23 mg/dL    Creatinine, Ser 4.54  0.50 - 1.35 mg/dL    Calcium 9.6  8.4 - 09.8 mg/dL    Total Protein 8.1  6.0 - 8.3 g/dL    Albumin 3.8  3.5 - 5.2 g/dL    AST 52 (*) 0 - 37 U/L    ALT 59 (*) 0 - 53 U/L    Alkaline Phosphatase 130 (*) 39 - 117 U/L    Total Bilirubin 1.0  0.3 - 1.2 mg/dL    GFR calc non Af Amer >90  >90 mL/min    GFR calc Af Amer >90  >90 mL/min   URIC ACID     Status: Abnormal   Collection Time   07/21/12  8:52 AM      Component Value Range Comment   Uric Acid, Serum 11.7 (*) 4.0 - 7.8 mg/dL    Dg Elbow Complete Right  07/21/2012  *RADIOLOGY REPORT*  Clinical Data: Swelling and redness about the posterior aspect of the elbow.  RIGHT ELBOW - COMPLETE 3+ VIEW  Comparison: Plain films 02/24/2009.  Findings: Soft tissues over the olecranon appear swollen.  No fracture, dislocation or joint effusion  is identified. No radiopaque foreign body or soft tissue gas collection is seen.  IMPRESSION: Soft tissue swelling over the olecranon compatible with olecranon bursitis.  No acute bony abnormality.   Original Report Authenticated By: Holley Dexter, M.D.    Dg Wrist Complete Right  07/21/2012  *RADIOLOGY REPORT*  Clinical Data: Arm swelling  RIGHT WRIST - COMPLETE 3+ VIEW  Comparison: 11/20/2009  Findings: Four views of the right wrist submitted.  No acute fracture or subluxation.  No radiopaque foreign body.  IMPRESSION: No acute fracture or subluxation.   Original Report Authenticated By: Natasha Mead, M.D.     Review of Systems  Constitutional: Negative.   HENT: Negative.   Eyes: Negative.   Respiratory: Negative.   Cardiovascular: Negative.   Gastrointestinal: Positive for heartburn.  Genitourinary: Negative.   Musculoskeletal:       See history of present illness  Skin: Negative.   Neurological: Negative.   Endo/Heme/Allergies: Negative.     Blood pressure 143/100, pulse 74, temperature 98.2 F (36.8 C), temperature source Oral, resp. rate 20, SpO2 98.00%. Physical Exam  The patient is pleasant in no acute distress, cooperative. Head: atraumatic Chest ; equal expansions presents, respirations are non-labored Right upper extremity: The patient has notable swelling about the posterior elbow with overlying erythema present he is exquisitely tender to light touch and with any attempts at active or passive range of motion about the elbow. He has no obvious purulent drainage but does have 1 area of eschar about the olecranon process which he states has been there for a long time. The surrounding soft tissue swelling about the elbow but does not have a significantly large and boggy bursa at this juncture. Evaluation of the dorsal aspect of the wrist shows he has an approximately and 3.5 x 3 and half centimeter region of erythema and swelling which is exquisitely tender to touch in addition  he has significant pain with attempts at wrist range of  motion both active and passive, neurovascularly he is intact to the upper  Lab are reviewed the patient is currently afebrile he does not have significant leukocytosis but does have an elevated uric acid level of 11.7 in addition he has increased hepatic function present Assessment/Plan Right elbow and wrist gouty exacerbation with the involving cellulitis and hospital infectious bursitis right elbow. History of uncontrolled hypertension History of alcohol abuse History of peptic ulcer History of GERD History of allergic rhinitis History of tobacco abuse History of gout About a lengthy discussion with the patient at concerns in regards to his upper extremity, at this juncture we are going to make him n.p.o. he will need admitted to the floor for IV antibiotics in the form of vancomycin, given his penicillin allergy in addition will begin medical management of gout with cultures seen. We are going to observe him closely he understands certainly we may have to perform an I&D/operative intervention to the elbow but this juncture with an admitting IV antibiotics close observation And treatment of gout. Hospitalist will be consulted  given his multiple medical comorbidities and uncontrolled hypertension.   Dao Memmott L 07/21/2012, 12:08 PM

## 2012-07-21 NOTE — ED Notes (Signed)
Pt is here with right elbow swelling and redness/  Pt reports severe pain.  Pt has redness that started in right elbow and has spread down to fingers.  Pulse present.  Started 4 days ago

## 2012-07-21 NOTE — Progress Notes (Signed)
ANTIBIOTIC CONSULT NOTE - INITIAL  Pharmacy Consult for Vancomycin  Indication: cellulitis vs infectious bursitis   Allergies  Allergen Reactions  . Penicillins     REACTION: Rash, itching    Patient Measurements: Height: 6' (182.9 cm) Weight: 200 lb (90.719 kg) IBW/kg (Calculated) : 77.6   Vital Signs: Temp: 98.2 F (36.8 C) (12/17 0816) Temp src: Oral (12/17 0816) BP: 143/100 mmHg (12/17 1015) Pulse Rate: 74  (12/17 1015) Intake/Output from previous day:   Intake/Output from this shift:    Labs:  [facility] 07/21/12 0852  WBC 10.0  HGB 15.4  PLT 197  LABCREA --  CREATININE 0.82   Estimated Creatinine Clearance: 120.9 ml/min (by C-G formula based on Cr of 0.82). No results found for this basename: VANCOTROUGH:2,VANCOPEAK:2,VANCORANDOM:2,GENTTROUGH:2,GENTPEAK:2,GENTRANDOM:2,TOBRATROUGH:2,TOBRAPEAK:2,TOBRARND:2,AMIKACINPEAK:2,AMIKACINTROU:2,AMIKACIN:2, in the last 72 hours   Microbiology: No results found for this or any previous visit (from the past 720 hour(s)).  Medical History: History reviewed. No pertinent past medical history.  Assessment: [name] is a 46 year old male presenting to [facility] on 12/17 with right elbow and wrist gouty exacerbation with cellulitis and ? infectious bursitis to start vancomycin. Noted rash to penicillins. Currently patient is afebrile and wbc count within normal limits. No cultures drawn thus far. Patient will good renal function (crcl >133ml/min). No abx given in the ED.    Goal of Therapy:  Vancomycin trough level 10-15 mcg/ml  Plan:  Begin vancomcyin 1000mg  IV q8h F/u renal function, cultures drawn, and troughs  Thank you,  Brett Fairy, PharmD, BCPS 07/21/2012 1:14 PM   Brett Fairy N 07/21/2012,1:11 PM

## 2012-07-21 NOTE — Consult Note (Signed)
[facility] Medical Consultation  [name] ZOX:[id number] DOB: [birth year] DOA: 07/21/2012 PCP: Kerby Nora, MD   Requesting physician: Dr. Dominica Severin  Date of consultation: 07/21/2012  Reason for consultation: Evaluation and management of uncontrolled HTN.  Impression/Recommendations Principal Problem:  *Gout flare Active Problems:  TOBACCO USER  GERD  PEPTIC ULCER DISEASE  HTN (hypertension)  Alcohol abuse  Cellulitis of elbow   Uncontrolled hypertension  Remotely patient was on blood pressure medications but cannot recollect name. Has not been on any medications for quite a while.  Has not recently checked blood pressures.  Blood pressure as high as 167/112 mmHg in the ED-some of this may be precipitated by pain.  We'll start amlodipine 5 mg by mouth daily and monitor.  Patient counseled regarding compliance with medications and M.D. followups. He verbalized understanding.  Gouty exacerbation of right elbow and wrist with possible cellulitis complicating right elbow. Rule out infected bursitis.  Management per primary team.  Plans for oral colchicine, IV vancomycin and reevaluation in a few hours-if no improvement then possible I&D.  Pain management.  Consider allopurinol when acute phase has completely resolved.  Tobacco abuse  Cessation counseled.   Patient declines a nicotine patch.   Alcohol abuse  Place on Ativan protocol. Monitor.  Mild transaminitis, likely secondary to this.  History of perforated gastric ulcer/GERD/ongoing aspirin use  Place on PPIs.  No ongoing symptoms.  Advised to avoid ASA/NSAIDS.  History of psoriasis   The [facility] service will followup again tomorrow. Please contact us if we can be of assistance in the meanwhile. Thank you for this consultation.  Chief Complaint: Right elbow and wrist pain.  HPI:  46 year old male patient with history of untreated HTN, HL, psoriasis, Gout and prior I&D of  right elbow for same, possible perforated stomach ulcer related to NSAID use, tobacco and alcohol abuse presented to the ED on 12/17 due worsening the right elbow and wrist pain and swelling. He first noticed right elbow pain approximately a week ago. The next day this was associated with swelling and pain and swelling in the right wrist. She denies fever or chills. No history of trauma. He tried to control his symptoms over the next several days with over-the-counter aspirin. However the symptoms continued to progressively get worse. She rates pain as severe, nonradiating, worse with movement of the joints, associated with some redness of his right elbow, minimum relief with aspirin. He presented to the emergency department and was evaluated by the orthopods who suspect gouty flare of both the joints and possible super added cellulitis of his right elbow. They plan close observation for next several hours and if no improvement in possible I&D for infection.  Review of Systems:  All systems reviewed and apart from history of presenting illness, negative.  Past Medical History  Diagnosis Date  . HTN (hypertension)   . Hyperlipidemia   . Gout   . Psoriasis   . H/O gastroesophageal reflux (GERD)    Past Surgical History  Procedure Date  . Hip surgery     right  . Abdominal surgery     ?perforated stomach ulcer  . Elbow surgery     right  . Foot surgery     left  . Back surgery     Twice   Social History:  reports that he has been smoking.  He does not have any smokeless tobacco history on file. He reports that he drinks alcohol. He reports that he does not use  illicit drugs. Patient smokes half a pack of cigarettes per day. He drinks a 12 pack of beers with his friends on weekends and also does 2-3 beers every other day. Occasionally consumes hard liquor. Denies drug abuse. Single and independent of activities of daily living.  Allergies  Allergen Reactions  . Penicillins     REACTION:  Rash, itching   Family History  Problem Relation Age of Onset  . Diabetes Mother   . Stroke Brother   . Hypertension Father     Prior to Admission medications   Medication Sig Start Date End Date Taking? Authorizing Provider  aspirin 500 MG EC tablet Take 500 mg by mouth every 6 (six) hours as needed. For pain   Yes Historical Provider, MD   Physical Exam: Blood pressure 133/94, pulse 64, temperature 98.2 F (36.8 C), temperature source Oral, resp. rate 20, height 6' (1.829 m), weight 90.719 kg (200 lb), SpO2 98.00%. Filed Vitals:   07/21/12 1015 07/21/12 1300 07/21/12 1306 07/21/12 1330  BP: 143/100   133/94  Pulse: 74   64  Temp:      TempSrc:      Resp:      Height:  6' (1.829 m) 6' (1.829 m)   Weight:  90.719 kg (200 lb) 90.719 kg (200 lb)   SpO2: 98%   98%     General exam: Moderately built and nourished male patient, lying comfortably on the gurney and in no obvious distress.  Head, eyes and ENT: Nontraumatic and normocephalic. Pupils equally reacting to light and accommodation. Oral mucosa moist.  Neck: Supple. No JVD, carotid bruit or thyromegaly.  Lymphatics: No lymphadenopathy.  Respiratory system: Clear to auscultation. No increased work of breathing.  Cardiovascular system: First and second heart sounds heard, regular rate and rhythm. No JVD, murmurs or gallops. No pedal edema.  Gastrointestinal system: Abdomen is nondistended, soft and nontender. No organomegaly or masses appreciated. Normal bowel sounds heard.  Central nervous system: Alert and oriented. No focal neurological deficits.  Extremities: Right upper extremity: Right elbow and wrist have been wrapped by primary service and unable to examine at this time. Able to freely move fingers and normal color. Left upper extremity and bilateral lower extremity grade 5/5 power. Peripheral pulses symmetrically felt.  Skin: No acute findings. Patient has small superficial psoriatic rash on left lower  shin.  Musculoskeletal system: Lower midline lumbar surgical scar. Otherwise negative.  Psychiatry: Pleasant and cooperative. Not anxious or tremulous.   Labs on Admission:  Basic Metabolic Panel:  Lab 07/21/12 0865  NA 136  K 3.8  CL 97  CO2 22  GLUCOSE 125*  BUN 7  CREATININE 0.82  CALCIUM 9.6  MG --  PHOS --   Liver Function Tests:  Lab 07/21/12 0852  AST 52*  ALT 59*  ALKPHOS 130*  BILITOT 1.0  PROT 8.1  ALBUMIN 3.8   No results found for this basename: LIPASE:5,AMYLASE:5 in the last 168 hours No results found for this basename: AMMONIA:5 in the last 168 hours CBC:  Lab 07/21/12 0852  WBC 10.0  NEUTROABS 6.5  HGB 15.4  HCT 43.5  MCV 88.8  PLT 197   Cardiac Enzymes: No results found for this basename: CKTOTAL:5,CKMB:5,CKMBINDEX:5,TROPONINI:5 in the last 168 hours BNP: No components found with this basename: POCBNP:5 CBG: No results found for this basename: GLUCAP:5 in the last 168 hours  Radiological Exams on Admission: Dg Elbow Complete Right  07/21/2012  *RADIOLOGY REPORT*  Clinical Data: Swelling and redness  about the posterior aspect of the elbow.  RIGHT ELBOW - COMPLETE 3+ VIEW  Comparison: Plain films 02/24/2009.  Findings: Soft tissues over the olecranon appear swollen.  No fracture, dislocation or joint effusion is identified. No radiopaque foreign body or soft tissue gas collection is seen.  IMPRESSION: Soft tissue swelling over the olecranon compatible with olecranon bursitis.  No acute bony abnormality.   Original Report Authenticated By: Holley Dexter, M.D.    Dg Wrist Complete Right  07/21/2012  *RADIOLOGY REPORT*  Clinical Data: Arm swelling  RIGHT WRIST - COMPLETE 3+ VIEW  Comparison: 11/20/2009  Findings: Four views of the right wrist submitted.  No acute fracture or subluxation.  No radiopaque foreign body.  IMPRESSION: No acute fracture or subluxation.   Original Report Authenticated By: Natasha Mead, M.D.     Time spent: 50  minutes  [facility] Pager [phone]  If 7PM-7AM, please contact night-coverage [email or web address] Password [email or web address] 07/21/2012, 2:13 PM

## 2012-07-21 NOTE — ED Provider Notes (Signed)
History     CSN: [id number]  Arrival date & time 07/21/12  0804   First MD Initiated Contact with Patient 07/21/12 [phone]      Chief Complaint  Patient presents with  . Arm Swelling    (Consider location/radiation/quality/duration/timing/severity/associated sxs/prior treatment) HPI Pt present with 4 days of R elbow pain, redness, warmth and decreased mobility. No history of trauma. Pt has also had several days of R wrist pain with swelling and redness. No fever or chills. Joints are tender to light touch. Pt had bursitis that needed drainage 3 years ago in same elbow.  History reviewed. No pertinent past medical history.  Past Surgical History  Procedure Date  . Hip surgery   . Abdominal surgery   . Elbow surgery     right  . Foot surgery   . Back surgery     No family history on file.  History  Substance Use Topics  . Smoking status: Current Every Day Smoker  . Smokeless tobacco: Not on file  . Alcohol Use: Yes     Comment: occ      Review of Systems  Constitutional: Negative for fever, chills and fatigue.  Musculoskeletal: Positive for arthralgias. Negative for myalgias.  Skin: Positive for color change. Negative for wound.  All other systems reviewed and are negative.    Allergies  Penicillins  Home Medications   Current Outpatient Rx  Name  Route  Sig  Dispense  Refill  . ASPIRIN 500 MG PO TBEC   Oral   Take 500 mg by mouth every 6 (six) hours as needed. For pain           BP 133/94  Pulse 64  Temp 98.2 F (36.8 C) (Oral)  Resp 20  Ht 6' (1.829 m)  Wt 200 lb (90.719 kg)  BMI 27.12 kg/m2  SpO2 98%  Physical Exam  Nursing note and vitals reviewed. Constitutional: He is oriented to person, place, and time. He appears well-developed and well-nourished. No distress.  HENT:  Head: Normocephalic and atraumatic.  Mouth/Throat: Oropharynx is clear and moist.  Eyes: EOM are normal. Pupils are equal, round, and reactive to light.  Neck: Normal  range of motion. Neck supple.  Cardiovascular: Normal rate and regular rhythm.   Pulmonary/Chest: Effort normal and breath sounds normal. No respiratory distress. He has no wheezes. He has no rales.  Abdominal: Soft. Bowel sounds are normal. He exhibits no distension and no mass. There is no tenderness. There is no rebound and no guarding.  Musculoskeletal: He exhibits tenderness. He exhibits no edema.       Decreased ROM of R wrist and elbow due to pain. TTP to light touch over joint. Prominent swelling over olecranon. Redness noted over posterior elbow and wrist without tracking. 2+ radial pulse. Sensation intact  Neurological: He is alert and oriented to person, place, and time.  Skin: Skin is warm and dry. No rash noted. There is erythema.  Psychiatric: He has a normal mood and affect. His behavior is normal.    ED Course  Procedures (including critical care time)  Labs Reviewed  CBC WITH DIFFERENTIAL - Abnormal; Notable for the following:    Monocytes Absolute 1.2 (*)     All other components within normal limits  COMPREHENSIVE METABOLIC PANEL - Abnormal; Notable for the following:    Glucose, Bld 125 (*)     AST 52 (*)     ALT 59 (*)     Alkaline Phosphatase 130 (*)  All other components within normal limits  URIC ACID - Abnormal; Notable for the following:    Uric Acid, Serum 11.7 (*)     All other components within normal limits   Dg Elbow Complete Right  07/21/2012  *RADIOLOGY REPORT*  Clinical Data: Swelling and redness about the posterior aspect of the elbow.  RIGHT ELBOW - COMPLETE 3+ VIEW  Comparison: Plain films 02/24/2009.  Findings: Soft tissues over the olecranon appear swollen.  No fracture, dislocation or joint effusion is identified. No radiopaque foreign body or soft tissue gas collection is seen.  IMPRESSION: Soft tissue swelling over the olecranon compatible with olecranon bursitis.  No acute bony abnormality.   Original Report Authenticated By: Holley Dexter, M.D.    Dg Wrist Complete Right  07/21/2012  *RADIOLOGY REPORT*  Clinical Data: Arm swelling  RIGHT WRIST - COMPLETE 3+ VIEW  Comparison: 11/20/2009  Findings: Four views of the right wrist submitted.  No acute fracture or subluxation.  No radiopaque foreign body.  IMPRESSION: No acute fracture or subluxation.   Original Report Authenticated By: Natasha Mead, M.D.      1. Infection of elbow       MDM   Dr Amanda Pea to admit.        Loren Racer, MD 07/21/12 1350

## 2012-07-22 DIAGNOSIS — R1013 Epigastric pain: Secondary | ICD-10-CM

## 2012-07-22 DIAGNOSIS — J309 Allergic rhinitis, unspecified: Secondary | ICD-10-CM

## 2012-07-22 DIAGNOSIS — L089 Local infection of the skin and subcutaneous tissue, unspecified: Secondary | ICD-10-CM

## 2012-07-22 LAB — CBC WITH DIFFERENTIAL/PLATELET
Basophils Relative: 1 % (ref 0–1)
Eosinophils Absolute: 0.5 10*3/uL (ref 0.0–0.7)
Eosinophils Relative: 6 % — ABNORMAL HIGH (ref 0–5)
Hemoglobin: 13 g/dL (ref 13.0–17.0)
Lymphs Abs: 1.5 10*3/uL (ref 0.7–4.0)
MCH: 30.3 pg (ref 26.0–34.0)
MCHC: 33.7 g/dL (ref 30.0–36.0)
MCV: 90 fL (ref 78.0–100.0)
Monocytes Relative: 10 % (ref 3–12)
Neutrophils Relative %: 65 % (ref 43–77)
RBC: 4.29 MIL/uL (ref 4.22–5.81)

## 2012-07-22 MED ORDER — HYDROCODONE-ACETAMINOPHEN 5-325 MG PO TABS
1.0000 | ORAL_TABLET | ORAL | Status: DC | PRN
  Administered 2012-07-22 – 2012-07-23 (×5): 2 via ORAL
  Filled 2012-07-22 (×5): qty 2

## 2012-07-22 MED ORDER — CLONIDINE HCL 0.1 MG PO TABS
0.1000 mg | ORAL_TABLET | Freq: Four times a day (QID) | ORAL | Status: DC | PRN
  Filled 2012-07-22: qty 1

## 2012-07-22 NOTE — Progress Notes (Signed)
Nutrition Brief Note  Patient identified on the Malnutrition Screening Tool (MST) Report. Upon chart review, pt with stable weight and adequate meal intake. Pt reports usual weight is 198 - 200 lb. This is consistent with current weight.  Body mass index is 27.12 kg/(m^2). Pt meets criteria for Overweight based on current BMI.   Current diet order is Regular, patient is consuming approximately 100% of meals at this time. Labs and medications reviewed.   No nutrition interventions warranted at this time. If nutrition issues arise, please consult RD.   Jarold Motto MS, RD, LDN Pager: [phone] After-hours pager: [phone]

## 2012-07-22 NOTE — Progress Notes (Signed)
Subjective: Patient improved overall in regards to upper extremity. Pain improving. Denies worsening.No fever chills or sob.   Objective: Vital signs in last 24 hours: Temp:  [97.6 F (36.4 C)-98.1 F (36.7 C)] 98.1 F (36.7 C) (12/18 1332) Pulse Rate:  [58-77] 77  (12/18 1332) Resp:  [18-20] 20  (12/18 1332) BP: (134-175)/(77-100) 151/99 mmHg (12/18 1332) SpO2:  [97 %-100 %] 100 % (12/18 1332)  Intake/Output from previous day: 12/17 0701 - 12/18 0700 In: 1833.7 [P.O.:960; I.V.:673.7; IV Piggyback:200] Out: 1800 [Urine:1800] Intake/Output this shift: Total I/O In: 720 [P.O.:720] Out: 1100 [Urine:1100]   Basename 07/22/12 0620 07/21/12 0852  HGB 13.0 15.4    Basename 07/22/12 0620 07/21/12 0852  WBC 7.7 10.0  RBC 4.29 4.90  HCT 38.6* 43.5  PLT 177 197    Basename 07/21/12 0852  NA 136  K 3.8  CL 97  CO2 22  BUN 7  CREATININE 0.82  GLUCOSE 125*  CALCIUM 9.6   No results found for this basename: LABPT:2,INR:2 in the last 72 hours  Pt. Pleasant,NAD,  Rue; erythema improved, swelling improving, no abscess at this juncture, overall improved without worsening, no worsening cellulitis, N/V intact Assessment/Plan:  No plans for surgical intervention at this juncture, continue gouty treatment, patient improving overall. Continue current treatment regime.   Adreena Willits L 07/22/2012, 1:38 PM

## 2012-07-22 NOTE — Progress Notes (Signed)
[facility] consult note                                                                                Patient Demographics  [name], is a 46 y.o. male  LKG:[id number]  GUY:[id number]  DOB - Mar 31, 1966  Admit date - 07/21/2012  Admitting Physician Dominica Severin, MD  Outpatient Primary MD for the patient is No primary provider on file.  LOS - 1   Chief Complaint  Patient presents with  . Arm Swelling        Assessment & Plan    Uncontrolled hypertension  Blood pressure is much better with present regimen which is Norvasc, we'll continue to monitor blood pressure.    Gouty exacerbation of right elbow and wrist with possible cellulitis complicating right elbow. Rule out infected bursitis.  Management per primary team.     Tobacco abuse  Cessation counseled.  Patient declines a nicotine patch.     Alcohol abuse  Place on Ativan protocol. Monitor CIWA protocol. Mild transaminitis, likely secondary to this. Post discharge outpatient CMP monitoring by primary care physician, one-time followup with GI outpatient is recommended after discharge.    History of perforated gastric ulcer/GERD/ongoing aspirin use  Placed on PPIs.  No ongoing symptoms.  Advised to avoid ASA/NSAIDS.   History of psoriasis Table outpatient followup with PCP in dermatologist as needed.     DVT Prophylaxis  Ted's and SCDs   Lab Results  Component Value Date   PLT 177 07/22/2012    Medications  Scheduled Meds:   . amLODipine  5 mg Oral Daily  . colchicine  0.6 mg Oral TID  . folic acid  1 mg Oral Daily  . multivitamin with minerals  1 tablet Oral Daily  . pantoprazole  40 mg Oral Q1200  . thiamine  100 mg Oral Daily   Or  . thiamine  100 mg Intravenous Daily  . vancomycin  1,000 mg Intravenous Q8H   Continuous Infusions:   . sodium chloride 100 mL/hr at 07/22/12 0123   PRN Meds:.LORazepam, LORazepam, morphine injection, ondansetron (ZOFRAN) IV,  ondansetron  Antibiotics    Anti-infectives     Start     Dose/Rate Route Frequency Ordered Stop   07/21/12 1330   vancomycin (VANCOCIN) IVPB 1000 mg/200 mL premix        1,000 mg 200 mL/hr over 60 Minutes Intravenous Every 8 hours 07/21/12 1315             Time Spent in minutes  35   SINGH,PRASHANT K M.D on 07/22/2012 at 11:56 AM  Between 7am to 7pm - Pager - [phone]  After 7pm go to [email or web address] - password [email or web address]  And look for the night coverage person covering for me after hours  [facility] Office  [phone]    Subjective:   [name] today has, No headache, No chest pain, No abdominal pain - No Nausea, No new weakness tingling or numbness, No Cough - SOB. Has right elbow and wrist pain  Objective:   Filed Vitals:   07/21/12 1743 07/21/12 2134 07/22/12 0220 07/22/12 0626  BP: 159/98 135/77 134/82 134/95  Pulse: 69 62 63  58  Temp: 97.9 F (36.6 C) 98.1 F (36.7 C) 97.6 F (36.4 C) 97.8 F (36.6 C)  TempSrc: Oral Oral Oral Oral  Resp: 20 18 18 20   Height:      Weight:      SpO2: 98% 98% 98% 97%    Wt Readings from Last 3 Encounters:  07/21/12 90.719 kg (200 lb)  12/13/09 86.637 kg (191 lb)  03/24/07 88.27 kg (194 lb 9.6 oz)     Intake/Output Summary (Last 24 hours) at 07/22/12 1156 Last data filed at 07/22/12 0956  Gross per 24 hour  Intake 2193.67 ml  Output   2350 ml  Net -156.33 ml    Exam Awake Alert, Oriented X 3, No new F.N deficits, Normal affect [place].AT,PERRAL Supple Neck,No JVD, No cervical lymphadenopathy appriciated.  Symmetrical Chest wall movement, Good air movement bilaterally, CTAB RRR,No Gallops,Rubs or new Murmurs, No Parasternal Heave +ve B.Sounds, Abd Soft, Non tender, No organomegaly appriciated, No rebound - guarding or rigidity. No Cyanosis, Clubbing or edema, No new Rash or bruise, right elbow and wrist under bandage   Data Review   Micro Results No results found for this or any previous visit  (from the past 240 hour(s)).  Radiology Reports Dg Elbow Complete Right  07/21/2012  *RADIOLOGY REPORT*  Clinical Data: Swelling and redness about the posterior aspect of the elbow.  RIGHT ELBOW - COMPLETE 3+ VIEW  Comparison: Plain films 02/24/2009.  Findings: Soft tissues over the olecranon appear swollen.  No fracture, dislocation or joint effusion is identified. No radiopaque foreign body or soft tissue gas collection is seen.  IMPRESSION: Soft tissue swelling over the olecranon compatible with olecranon bursitis.  No acute bony abnormality.   Original Report Authenticated By: Holley Dexter, M.D.    Dg Wrist Complete Right  07/21/2012  *RADIOLOGY REPORT*  Clinical Data: Arm swelling  RIGHT WRIST - COMPLETE 3+ VIEW  Comparison: 11/20/2009  Findings: Four views of the right wrist submitted.  No acute fracture or subluxation.  No radiopaque foreign body.  IMPRESSION: No acute fracture or subluxation.   Original Report Authenticated By: Natasha Mead, M.D.     CBC  Lab 07/22/12 0620 07/21/12 0852  WBC 7.7 10.0  HGB 13.0 15.4  HCT 38.6* 43.5  PLT 177 197  MCV 90.0 88.8  MCH 30.3 31.4  MCHC 33.7 35.4  RDW 13.0 13.0  LYMPHSABS 1.5 1.9  MONOABS 0.8 1.2*  EOSABS 0.5 0.3  BASOSABS 0.0 0.1  BANDABS -- --    Chemistries   Lab 07/21/12 0852  NA 136  K 3.8  CL 97  CO2 22  GLUCOSE 125*  BUN 7  CREATININE 0.82  CALCIUM 9.6  MG --  AST 52*  ALT 59*  ALKPHOS 130*  BILITOT 1.0   ------------------------------------------------------------------------------------------------------------------ estimated creatinine clearance is 120.9 ml/min (by C-G formula based on Cr of 0.82). ------------------------------------------------------------------------------------------------------------------ No results found for this basename: HGBA1C:2 in the last 72 hours ------------------------------------------------------------------------------------------------------------------ No results  found for this basename: CHOL:2,HDL:2,LDLCALC:2,TRIG:2,CHOLHDL:2,LDLDIRECT:2 in the last 72 hours ------------------------------------------------------------------------------------------------------------------ No results found for this basename: TSH,T4TOTAL,FREET3,T3FREE,THYROIDAB in the last 72 hours ------------------------------------------------------------------------------------------------------------------ No results found for this basename: VITAMINB12:2,FOLATE:2,FERRITIN:2,TIBC:2,IRON:2,RETICCTPCT:2 in the last 72 hours  Coagulation profile No results found for this basename: INR:5,PROTIME:5 in the last 168 hours  No results found for this basename: DDIMER:2 in the last 72 hours  Cardiac Enzymes No results found for this basename: CK:3,CKMB:3,TROPONINI:3,MYOGLOBIN:3 in the last 168 hours ------------------------------------------------------------------------------------------------------------------ No components found with this basename: POCBNP:3

## 2012-07-23 DIAGNOSIS — M109 Gout, unspecified: Secondary | ICD-10-CM

## 2012-07-23 LAB — GLUCOSE, CAPILLARY

## 2012-07-23 MED ORDER — ALLOPURINOL 300 MG PO TABS: 300.0000 mg | ORAL_TABLET | Freq: Every day | ORAL | Status: DC

## 2012-07-23 MED ORDER — CLONIDINE HCL 0.1 MG PO TABS
0.1000 mg | ORAL_TABLET | Freq: Three times a day (TID) | ORAL | Status: DC
  Administered 2012-07-23 (×2): 0.1 mg via ORAL
  Filled 2012-07-23 (×3): qty 1

## 2012-07-23 MED ORDER — COLCHICINE 0.6 MG PO TABS: 0.6000 mg | ORAL_TABLET | Freq: Every day | ORAL | Status: DC

## 2012-07-23 MED ORDER — DOXYCYCLINE HYCLATE 50 MG PO CAPS: 50.0000 mg | ORAL_CAPSULE | Freq: Two times a day (BID) | ORAL | Status: DC

## 2012-07-23 MED ORDER — AMLODIPINE BESYLATE 5 MG PO TABS: 5.0000 mg | ORAL_TABLET | Freq: Every day | ORAL | Status: DC

## 2012-07-23 NOTE — Discharge Summary (Signed)
Physician Discharge Summary  Patient ID: [name] MRN: [id number] DOB/AGE: [birth year] 48 y.o.  Admit date: 07/21/2012 Discharge date: 07/23/2012  Admission Diagnoses: Acute cellulitis and Attack about the elbow and wrist right upper extremity  Discharge Diagnoses: Same Principal Problem:  *Gout flare Active Problems:  TOBACCO USER  GERD  PEPTIC ULCER DISEASE  HTN (hypertension)  Alcohol abuse  Cellulitis of elbow   Discharged Condition: good  Hospital Course: Patient was admitted for cultures seen and antibiotics secondary to an acute cellulitic and gout attack about the elbow and wrist right upper extremity. He did well with conservative out rhythm of care and did not require surgical I&D. He ultimately did quite nicely and will be discharged home on antibiotics and gout medicine to prevent gouty flares in the future.  Consults: Hospitalist  Significant Diagnostic Studies: labs: Please see chart  Treatments: IV hydration, antibiotics: vancomycin and analgesia: acetaminophen and Vicodin  Discharge Exam: Blood pressure 150/98, pulse 52, temperature 97.4 F (36.3 C), temperature source Oral, resp. rate 20, height 6' (1.829 m), weight 90.719 kg (200 lb), SpO2 99.00%. General appearance: alert and cooperative..The patient is alert and oriented in no acute distress the patient complains of pain in the affected upper extremity. The patient is noted to have a normal HEENT exam. Lung fields show equal chest expansion and no shortness of breath abdomen exam is nontender without distention. Lower extremity examination does not show any fracture dislocation or blood clot symptoms. Pelvis is stable neck and back are stable and nontender  Elbow looks much improved as does the wrist. Cellulitis and pain have fully resolved with full range of motion and no complications.  Disposition:  stable      Medication List     As of 07/23/2012  5:26 PM    ASK your doctor about these  medications         aspirin 500 MG EC tablet   Take 500 mg by mouth every 6 (six) hours as needed. For pain           Follow-up Information    Follow up with Karen Chafe, MD. (Please call to see Dr. Amanda Pea ifyou have any problems)    Contact information:   [address] [place] [address] [phone]          Signed: Karen Chafe 07/23/2012, 5:26 PM

## 2012-07-23 NOTE — Discharge Instructions (Signed)
Please apply Neosporin and an Ace wrap to the wrist and elbow daily for the next 4 days.  Please take the antibiotic until it is finished to prevent infection  Please take your gout medicine (allopurinol) to prevent gout attacks.  Please take colchicine once daily for the next 5 days and then take it if you have a acute gout attack  Please call for any problems  As discussed with you, please engage a primary care physician to manage her gout long-term

## 2012-07-23 NOTE — Progress Notes (Addendum)
[facility] consult note                                                                                Patient Demographics  [name], is a 46 y.o. male  FAO:[id number]  [id number]  DOB - 1965-11-25  Admit date - 07/21/2012  Admitting Physician Dominica Severin, MD  Outpatient Primary MD for the patient is No primary provider on file.  LOS - 2   Chief Complaint  Patient presents with  . Arm Swelling        Assessment & Plan    Uncontrolled hypertension  Blood pressure is slightly high added Catapres to Norvasc and monitor.    Gouty exacerbation of right elbow and wrist with possible cellulitis complicating right elbow. Rule out infected bursitis.  Management per primary team.     Tobacco abuse  Cessation counseled.  Patient declines a nicotine patch.     Alcohol abuse  Place on Ativan protocol. Monitor CIWA protocol. Mild transaminitis, likely secondary to this. Post discharge outpatient CMP monitoring by primary care physician, one-time followup with GI outpatient is recommended after discharge.     History of perforated gastric ulcer/GERD/ongoing aspirin use  Placed on PPIs.  No ongoing symptoms.  Advised to avoid ASA/NSAIDS.    History of psoriasis Table outpatient followup with PCP in dermatologist as needed.     DVT Prophylaxis  Ted's and SCDs   Lab Results  Component Value Date   PLT 177 07/22/2012    Medications  Scheduled Meds:    . amLODipine  5 mg Oral Daily  . colchicine  0.6 mg Oral TID  . folic acid  1 mg Oral Daily  . multivitamin with minerals  1 tablet Oral Daily  . pantoprazole  40 mg Oral Q1200  . thiamine  100 mg Oral Daily   Or  . thiamine  100 mg Intravenous Daily  . vancomycin  1,000 mg Intravenous Q8H   Continuous Infusions:    . sodium chloride 100 mL/hr at 07/23/12 0055   PRN Meds:.cloNIDine, HYDROcodone-acetaminophen, LORazepam, LORazepam, morphine injection, ondansetron (ZOFRAN)  IV, ondansetron  Antibiotics    Anti-infectives     Start     Dose/Rate Route Frequency Ordered Stop   07/21/12 1330   vancomycin (VANCOCIN) IVPB 1000 mg/200 mL premix        1,000 mg 200 mL/hr over 60 Minutes Intravenous Every 8 hours 07/21/12 1315             Time Spent in minutes  35   Keo Schirmer K M.D on 07/23/2012 at 10:33 AM  Between 7am to 7pm - Pager - [phone]  After 7pm go to [email or web address] - password [email or web address]  And look for the night coverage person covering for me after hours  [facility] Office  [phone]    Subjective:   [name] today has, No headache, No chest pain, No abdominal pain - No Nausea, No new weakness tingling or numbness, No Cough - SOB. Has right elbow and wrist pain  Objective:   Filed Vitals:   07/22/12 1802 07/22/12 2210 07/23/12 0601 07/23/12 0802  BP: 144/94 147/95 149/106   Pulse: 62 69 115  76  Temp: 98.1 F (36.7 C) 98.3 F (36.8 C) 98 F (36.7 C)   TempSrc: Oral Oral Oral   Resp: 18 18 18    Height:      Weight:      SpO2: 100% 99% 99%     Wt Readings from Last 3 Encounters:  07/21/12 90.719 kg (200 lb)  12/13/09 86.637 kg (191 lb)  03/24/07 88.27 kg (194 lb 9.6 oz)     Intake/Output Summary (Last 24 hours) at 07/23/12 1033 Last data filed at 07/23/12 0810  Gross per 24 hour  Intake   2972 ml  Output   3600 ml  Net   -628 ml    Exam Awake Alert, Oriented X 3, No new F.N deficits, Normal affect [place].AT,PERRAL Supple Neck,No JVD, No cervical lymphadenopathy appriciated.  Symmetrical Chest wall movement, Good air movement bilaterally, CTAB RRR,No Gallops,Rubs or new Murmurs, No Parasternal Heave +ve B.Sounds, Abd Soft, Non tender, No organomegaly appriciated, No rebound - guarding or rigidity. No Cyanosis, Clubbing or edema, No new Rash or bruise, right elbow and wrist under bandage   Data Review   Micro Results No results found for this or any previous visit (from the past 240  hour(s)).  Radiology Reports Dg Elbow Complete Right  07/21/2012  *RADIOLOGY REPORT*  Clinical Data: Swelling and redness about the posterior aspect of the elbow.  RIGHT ELBOW - COMPLETE 3+ VIEW  Comparison: Plain films 02/24/2009.  Findings: Soft tissues over the olecranon appear swollen.  No fracture, dislocation or joint effusion is identified. No radiopaque foreign body or soft tissue gas collection is seen.  IMPRESSION: Soft tissue swelling over the olecranon compatible with olecranon bursitis.  No acute bony abnormality.   Original Report Authenticated By: Holley Dexter, M.D.    Dg Wrist Complete Right  07/21/2012  *RADIOLOGY REPORT*  Clinical Data: Arm swelling  RIGHT WRIST - COMPLETE 3+ VIEW  Comparison: 11/20/2009  Findings: Four views of the right wrist submitted.  No acute fracture or subluxation.  No radiopaque foreign body.  IMPRESSION: No acute fracture or subluxation.   Original Report Authenticated By: Natasha Mead, M.D.     CBC  Lab 07/22/12 0620 07/21/12 0852  WBC 7.7 10.0  HGB 13.0 15.4  HCT 38.6* 43.5  PLT 177 197  MCV 90.0 88.8  MCH 30.3 31.4  MCHC 33.7 35.4  RDW 13.0 13.0  LYMPHSABS 1.5 1.9  MONOABS 0.8 1.2*  EOSABS 0.5 0.3  BASOSABS 0.0 0.1  BANDABS -- --    Chemistries   Lab 07/21/12 0852  NA 136  K 3.8  CL 97  CO2 22  GLUCOSE 125*  BUN 7  CREATININE 0.82  CALCIUM 9.6  MG --  AST 52*  ALT 59*  ALKPHOS 130*  BILITOT 1.0   ------------------------------------------------------------------------------------------------------------------ estimated creatinine clearance is 120.9 ml/min (by C-G formula based on Cr of 0.82). ------------------------------------------------------------------------------------------------------------------ No results found for this basename: HGBA1C:2 in the last 72 hours ------------------------------------------------------------------------------------------------------------------ No results found for this  basename: CHOL:2,HDL:2,LDLCALC:2,TRIG:2,CHOLHDL:2,LDLDIRECT:2 in the last 72 hours ------------------------------------------------------------------------------------------------------------------ No results found for this basename: TSH,T4TOTAL,FREET3,T3FREE,THYROIDAB in the last 72 hours ------------------------------------------------------------------------------------------------------------------ No results found for this basename: VITAMINB12:2,FOLATE:2,FERRITIN:2,TIBC:2,IRON:2,RETICCTPCT:2 in the last 72 hours  Coagulation profile No results found for this basename: INR:5,PROTIME:5 in the last 168 hours  No results found for this basename: DDIMER:2 in the last 72 hours  Cardiac Enzymes No results found for this basename: CK:3,CKMB:3,TROPONINI:3,MYOGLOBIN:3 in the last 168 hours ------------------------------------------------------------------------------------------------------------------ No components found with this basename: POCBNP:3

## 2013-04-15 ENCOUNTER — Ambulatory Visit: Payer: Self-pay | Admitting: Nurse Practitioner

## 2014-04-25 ENCOUNTER — Encounter: Payer: Self-pay | Admitting: Surgery

## 2015-02-13 ENCOUNTER — Encounter (HOSPITAL_COMMUNITY): Payer: Self-pay | Admitting: Emergency Medicine

## 2015-02-13 ENCOUNTER — Emergency Department (HOSPITAL_COMMUNITY)
Admission: EM | Admit: 2015-02-13 | Discharge: 2015-02-13 | Disposition: A | Discharge status: Home / Self Care | Payer: Medicare Other | Attending: Emergency Medicine | Admitting: Emergency Medicine

## 2015-02-13 DIAGNOSIS — I10 Essential (primary) hypertension: Secondary | ICD-10-CM | POA: Diagnosis not present

## 2015-02-13 DIAGNOSIS — H6001 Abscess of right external ear: Secondary | ICD-10-CM

## 2015-02-13 DIAGNOSIS — Z8781 Personal history of (healed) traumatic fracture: Secondary | ICD-10-CM | POA: Insufficient documentation

## 2015-02-13 DIAGNOSIS — R1013 Epigastric pain: Secondary | ICD-10-CM | POA: Diagnosis not present

## 2015-02-13 DIAGNOSIS — Z7982 Long term (current) use of aspirin: Secondary | ICD-10-CM | POA: Insufficient documentation

## 2015-02-13 DIAGNOSIS — Z872 Personal history of diseases of the skin and subcutaneous tissue: Secondary | ICD-10-CM | POA: Diagnosis not present

## 2015-02-13 DIAGNOSIS — Z9889 Other specified postprocedural states: Secondary | ICD-10-CM | POA: Diagnosis not present

## 2015-02-13 DIAGNOSIS — K219 Gastro-esophageal reflux disease without esophagitis: Secondary | ICD-10-CM | POA: Insufficient documentation

## 2015-02-13 DIAGNOSIS — R630 Anorexia: Secondary | ICD-10-CM | POA: Diagnosis not present

## 2015-02-13 DIAGNOSIS — Z79899 Other long term (current) drug therapy: Secondary | ICD-10-CM | POA: Diagnosis not present

## 2015-02-13 DIAGNOSIS — E785 Hyperlipidemia, unspecified: Secondary | ICD-10-CM | POA: Diagnosis not present

## 2015-02-13 DIAGNOSIS — M1990 Unspecified osteoarthritis, unspecified site: Secondary | ICD-10-CM | POA: Insufficient documentation

## 2015-02-13 DIAGNOSIS — Z880 Allergy status to penicillin: Secondary | ICD-10-CM | POA: Diagnosis not present

## 2015-02-13 DIAGNOSIS — Z720 Tobacco use: Secondary | ICD-10-CM | POA: Diagnosis not present

## 2015-02-13 DIAGNOSIS — H6011 Cellulitis of right external ear: Secondary | ICD-10-CM | POA: Insufficient documentation

## 2015-02-13 DIAGNOSIS — M109 Gout, unspecified: Secondary | ICD-10-CM | POA: Insufficient documentation

## 2015-02-13 LAB — CBC WITH DIFFERENTIAL/PLATELET
BASOS PCT: 1 % (ref 0–1)
Basophils Absolute: 0.1 10*3/uL (ref 0.0–0.1)
EOS ABS: 0.3 10*3/uL (ref 0.0–0.7)
EOS PCT: 3 % (ref 0–5)
HCT: 51.3 % (ref 39.0–52.0)
HEMOGLOBIN: 17.7 g/dL — AB (ref 13.0–17.0)
LYMPHS PCT: 23 % (ref 12–46)
Lymphs Abs: 2.4 10*3/uL (ref 0.7–4.0)
MCH: 31 pg (ref 26.0–34.0)
MCHC: 34.5 g/dL (ref 30.0–36.0)
MCV: 89.8 fL (ref 78.0–100.0)
MONO ABS: 1 10*3/uL (ref 0.1–1.0)
MONOS PCT: 10 % (ref 3–12)
NEUTROS ABS: 6.8 10*3/uL (ref 1.7–7.7)
NEUTROS PCT: 63 % (ref 43–77)
PLATELETS: 267 10*3/uL (ref 150–400)
RBC: 5.71 MIL/uL (ref 4.22–5.81)
RDW: 13.7 % (ref 11.5–15.5)
WBC: 10.7 10*3/uL — AB (ref 4.0–10.5)

## 2015-02-13 LAB — COMPREHENSIVE METABOLIC PANEL
ALT: 35 U/L (ref 17–63)
AST: 36 U/L (ref 15–41)
Albumin: 4.3 g/dL (ref 3.5–5.0)
Alkaline Phosphatase: 80 U/L (ref 38–126)
Anion gap: 10 (ref 5–15)
BUN: 6 mg/dL (ref 6–20)
CO2: 29 mmol/L (ref 22–32)
CREATININE: 0.87 mg/dL (ref 0.61–1.24)
Calcium: 9.7 mg/dL (ref 8.9–10.3)
Chloride: 97 mmol/L — ABNORMAL LOW (ref 101–111)
GFR calc non Af Amer: >60 mL/min (ref >60)
GLUCOSE: 102 mg/dL — AB (ref 65–99)
POTASSIUM: 4.4 mmol/L (ref 3.5–5.1)
Sodium: 136 mmol/L (ref 135–145)
TOTAL PROTEIN: 8.4 g/dL — AB (ref 6.5–8.1)
Total Bilirubin: 1.4 mg/dL — ABNORMAL HIGH (ref 0.3–1.2)

## 2015-02-13 LAB — LIPASE, BLOOD: LIPASE: 31 U/L (ref 22–51)

## 2015-02-13 MED ORDER — LIDOCAINE HCL (PF) 1 % IJ SOLN
5.0000 mL | Freq: Once | INTRAMUSCULAR | Status: AC
  Administered 2015-02-13: 5 mL via INTRADERMAL
  Filled 2015-02-13: qty 5

## 2015-02-13 MED ORDER — PANTOPRAZOLE SODIUM 40 MG PO TBEC
40.0000 mg | DELAYED_RELEASE_TABLET | Freq: Once | ORAL | Status: AC
  Administered 2015-02-13: 40 mg via ORAL
  Filled 2015-02-13: qty 1

## 2015-02-13 MED ORDER — OMEPRAZOLE 40 MG PO CPDR: 40.0000 mg | DELAYED_RELEASE_CAPSULE | Freq: Every day | ORAL | Status: DC

## 2015-02-13 MED ORDER — SUCRALFATE 1 G PO TABS: 1.0000 g | ORAL_TABLET | Freq: Three times a day (TID) | ORAL | Status: DC

## 2015-02-13 MED ORDER — HYDROCODONE-ACETAMINOPHEN 5-325 MG PO TABS
2.0000 | ORAL_TABLET | Freq: Once | ORAL | Status: AC
  Administered 2015-02-13: 2 via ORAL
  Filled 2015-02-13: qty 2

## 2015-02-13 MED ORDER — GI COCKTAIL ~~LOC~~
30.0000 mL | Freq: Once | ORAL | Status: AC
  Administered 2015-02-13: 30 mL via ORAL
  Filled 2015-02-13: qty 30

## 2015-02-13 MED ORDER — SULFAMETHOXAZOLE-TRIMETHOPRIM 800-160 MG PO TABS: 1.0000 | ORAL_TABLET | Freq: Two times a day (BID) | ORAL | Status: AC
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
## 2015-02-13 NOTE — ED Notes (Signed)
#  1 Patient c/o of right ear pain states that he has an infection on his ear for 7 months has MD 4 times on antibiotic x2 states that it drains with he touches it.  #2 patient states that he has an ulcer in his stomach and it has been hurting for 3 weeks now and if he eats it gets worse. Patient states that he drinks 2-3 cans of beer a day last drank yesterday.

## 2015-02-13 NOTE — ED Notes (Signed)
Pt states that he has a stomach ulcer and is having abd pain.  Also c/o infection in pinna of right ear.

## 2015-02-13 NOTE — Discharge Instructions (Signed)
If you were given medicines take as directed.  If you are on coumadin or contraceptives realize their levels and effectiveness is altered by many different medicines.  If you have any reaction (rash, tongues swelling, other) to the medicines stop taking and see a physician.    If your blood pressure was elevated in the ER make sure you follow up for management with a primary doctor or return for chest pain, shortness of breath or stroke symptoms.  Please follow up as directed and return to the ER or see a physician for new or worsening symptoms.  Thank you. Filed Vitals:   02/13/15 1116 02/13/15 1413  BP: 195/105 158/99  Pulse: 66 61  Temp: 97.6 F (36.4 C)   TempSrc: Oral   Resp: 16 16  Height: 6' (1.829 m)   Weight: 200 lb (90.719 kg)   SpO2: 100% 100%

## 2015-02-23 ENCOUNTER — Encounter (HOSPITAL_COMMUNITY): Payer: Self-pay | Admitting: Emergency Medicine

## 2015-02-23 ENCOUNTER — Emergency Department (HOSPITAL_COMMUNITY)
Admission: EM | Admit: 2015-02-23 | Discharge: 2015-02-23 | Disposition: A | Discharge status: Home / Self Care | Payer: Medicare Other | Attending: Emergency Medicine | Admitting: Emergency Medicine

## 2015-02-23 DIAGNOSIS — M1990 Unspecified osteoarthritis, unspecified site: Secondary | ICD-10-CM | POA: Insufficient documentation

## 2015-02-23 DIAGNOSIS — J3489 Other specified disorders of nose and nasal sinuses: Secondary | ICD-10-CM | POA: Insufficient documentation

## 2015-02-23 DIAGNOSIS — H6011 Cellulitis of right external ear: Secondary | ICD-10-CM | POA: Diagnosis not present

## 2015-02-23 DIAGNOSIS — Z79899 Other long term (current) drug therapy: Secondary | ICD-10-CM | POA: Insufficient documentation

## 2015-02-23 DIAGNOSIS — K219 Gastro-esophageal reflux disease without esophagitis: Secondary | ICD-10-CM | POA: Insufficient documentation

## 2015-02-23 DIAGNOSIS — Z792 Long term (current) use of antibiotics: Secondary | ICD-10-CM | POA: Insufficient documentation

## 2015-02-23 DIAGNOSIS — E785 Hyperlipidemia, unspecified: Secondary | ICD-10-CM | POA: Diagnosis not present

## 2015-02-23 DIAGNOSIS — Z880 Allergy status to penicillin: Secondary | ICD-10-CM | POA: Diagnosis not present

## 2015-02-23 DIAGNOSIS — M109 Gout, unspecified: Secondary | ICD-10-CM | POA: Diagnosis not present

## 2015-02-23 DIAGNOSIS — I10 Essential (primary) hypertension: Secondary | ICD-10-CM | POA: Diagnosis not present

## 2015-02-23 DIAGNOSIS — Z7982 Long term (current) use of aspirin: Secondary | ICD-10-CM | POA: Diagnosis not present

## 2015-02-23 DIAGNOSIS — L409 Psoriasis, unspecified: Secondary | ICD-10-CM | POA: Insufficient documentation

## 2015-02-23 DIAGNOSIS — Z720 Tobacco use: Secondary | ICD-10-CM | POA: Diagnosis not present

## 2015-02-23 DIAGNOSIS — Z8781 Personal history of (healed) traumatic fracture: Secondary | ICD-10-CM | POA: Diagnosis not present

## 2015-02-23 DIAGNOSIS — H9201 Otalgia, right ear: Secondary | ICD-10-CM | POA: Diagnosis present

## 2015-02-23 MED ORDER — MUPIROCIN 2 % EX OINT: TOPICAL_OINTMENT | CUTANEOUS | Status: DC

## 2015-02-23 NOTE — ED Notes (Signed)
Pt reports right ear swelling x7 months. Pt reports has been to several doctors and px abx but "my ear still hurts and swells." pt reports last seen for same x2 weeks ago.

## 2015-02-23 NOTE — Discharge Instructions (Signed)
Abscess °An abscess (boil or furuncle) is an infected area on or under the skin. This area is filled with yellowish-white fluid (pus) and other material (debris). °HOME CARE  °· Only take medicines as told by your doctor. °· If you were given antibiotic medicine, take it as directed. Finish the medicine even if you start to feel better. °· If gauze is used, follow your doctor's directions for changing the gauze. °· To avoid spreading the infection: °¨ Keep your abscess covered with a bandage. °¨ Wash your hands well. °¨ Do not share personal care items, towels, or whirlpools with others. °¨ Avoid skin contact with others. °· Keep your skin and clothes clean around the abscess. °· Keep all doctor visits as told. °GET HELP RIGHT AWAY IF:  °· You have more pain, puffiness (swelling), or redness in the wound site. °· You have more fluid or blood coming from the wound site. °· You have muscle aches, chills, or you feel sick. °· You have a fever. °MAKE SURE YOU:  °· Understand these instructions. °· Will watch your condition. °· Will get help right away if you are not doing well or get worse. °Document Released: 01/08/2008 Document Revised: 01/21/2012 Document Reviewed: 10/04/2011 °[employer]® Patient Information ©2015 [employer]. This information is not intended to replace advice given to you by your health care provider. Make sure you discuss any questions you have with your health care provider. ° °
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
## 2016-04-08 ENCOUNTER — Encounter (HOSPITAL_COMMUNITY): Payer: Self-pay

## 2016-04-08 ENCOUNTER — Emergency Department (HOSPITAL_COMMUNITY)
Admission: EM | Admit: 2016-04-08 | Discharge: 2016-04-08 | Disposition: A | Discharge status: Home / Self Care | Payer: Medicare Other | Attending: Emergency Medicine | Admitting: Emergency Medicine

## 2016-04-08 ENCOUNTER — Emergency Department (HOSPITAL_COMMUNITY): Payer: Medicare Other

## 2016-04-08 DIAGNOSIS — K610 Anal abscess: Secondary | ICD-10-CM | POA: Diagnosis not present

## 2016-04-08 DIAGNOSIS — F17210 Nicotine dependence, cigarettes, uncomplicated: Secondary | ICD-10-CM | POA: Insufficient documentation

## 2016-04-08 DIAGNOSIS — I10 Essential (primary) hypertension: Secondary | ICD-10-CM | POA: Insufficient documentation

## 2016-04-08 DIAGNOSIS — Z79899 Other long term (current) drug therapy: Secondary | ICD-10-CM | POA: Diagnosis not present

## 2016-04-08 DIAGNOSIS — G8929 Other chronic pain: Secondary | ICD-10-CM | POA: Insufficient documentation

## 2016-04-08 DIAGNOSIS — K6289 Other specified diseases of anus and rectum: Secondary | ICD-10-CM | POA: Diagnosis present

## 2016-04-08 LAB — CBC WITH DIFFERENTIAL/PLATELET
BASOS ABS: 0 10*3/uL (ref 0.0–0.1)
BASOS PCT: 0 %
EOS ABS: 0.3 10*3/uL (ref 0.0–0.7)
EOS PCT: 3 %
HCT: 39.7 % (ref 39.0–52.0)
Hemoglobin: 13.7 g/dL (ref 13.0–17.0)
Lymphocytes Relative: 17 %
Lymphs Abs: 1.8 10*3/uL (ref 0.7–4.0)
MCH: 30.9 pg (ref 26.0–34.0)
MCHC: 34.5 g/dL (ref 30.0–36.0)
MCV: 89.6 fL (ref 78.0–100.0)
MONO ABS: 1.1 10*3/uL — AB (ref 0.1–1.0)
Monocytes Relative: 11 %
Neutro Abs: 7.1 10*3/uL (ref 1.7–7.7)
Neutrophils Relative %: 69 %
PLATELETS: 186 10*3/uL (ref 150–400)
RBC: 4.43 MIL/uL (ref 4.22–5.81)
RDW: 12.7 % (ref 11.5–15.5)
WBC: 10.4 10*3/uL (ref 4.0–10.5)

## 2016-04-08 LAB — BASIC METABOLIC PANEL
ANION GAP: 9 (ref 5–15)
BUN: 6 mg/dL (ref 6–20)
CALCIUM: 9.2 mg/dL (ref 8.9–10.3)
CO2: 25 mmol/L (ref 22–32)
Chloride: 95 mmol/L — ABNORMAL LOW (ref 101–111)
Creatinine, Ser: 0.77 mg/dL (ref 0.61–1.24)
GFR calc Af Amer: >60 mL/min (ref >60)
GLUCOSE: 225 mg/dL — AB (ref 65–99)
POTASSIUM: 4 mmol/L (ref 3.5–5.1)
SODIUM: 129 mmol/L — AB (ref 135–145)

## 2016-04-08 MED ORDER — METRONIDAZOLE 500 MG PO TABS: 500.0000 mg | ORAL_TABLET | Freq: Two times a day (BID) | ORAL | 0 refills | Status: DC

## 2016-04-08 MED ORDER — CIPROFLOXACIN HCL 500 MG PO TABS: 500.0000 mg | ORAL_TABLET | Freq: Two times a day (BID) | ORAL | 0 refills | Status: DC

## 2016-04-08 MED ORDER — IOPAMIDOL (ISOVUE-300) INJECTION 61%
100.0000 mL | Freq: Once | INTRAVENOUS | Status: AC | PRN
  Administered 2016-04-08: 100 mL via INTRAVENOUS

## 2016-04-08 MED ORDER — MORPHINE SULFATE (PF) 4 MG/ML IV SOLN
4.0000 mg | Freq: Once | INTRAVENOUS | Status: AC
  Administered 2016-04-08: 4 mg via INTRAVENOUS
  Filled 2016-04-08: qty 1

## 2016-04-08 MED ORDER — OXYCODONE-ACETAMINOPHEN 5-325 MG PO TABS: 1.0000 | ORAL_TABLET | ORAL | 0 refills | Status: DC | PRN

## 2016-04-08 MED ORDER — CIPROFLOXACIN HCL 250 MG PO TABS
500.0000 mg | ORAL_TABLET | Freq: Once | ORAL | Status: AC
  Administered 2016-04-08: 500 mg via ORAL
  Filled 2016-04-08: qty 2

## 2016-04-08 MED ORDER — METRONIDAZOLE 500 MG PO TABS
500.0000 mg | ORAL_TABLET | Freq: Once | ORAL | Status: AC
  Administered 2016-04-08: 500 mg via ORAL
  Filled 2016-04-08: qty 1

## 2016-04-08 NOTE — Discharge Instructions (Signed)
Cipro and Flagyl twice daily for 10 days, see Dr. Lovell SheehanJenkins in the morning

## 2016-04-08 NOTE — ED Triage Notes (Signed)
Complain of internal hemorrhoids for over two weeks.
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
## 2016-04-08 NOTE — ED Notes (Signed)
Pt transported to ct. Nad.

## 2016-04-08 NOTE — ED Notes (Signed)
Pt came in complaining of pain in his rectum for the last 2 weeks.  Pt thinks it might be hemorids but denies any bleeding currently just pain of 8 out of 10.  Pt reports some blood when wiping but he thinks it might be from his rectum being "a little raw".  Pt is resting in bed with family at the bedside.

## 2016-05-17 ENCOUNTER — Other Ambulatory Visit: Payer: Self-pay | Admitting: Neurosurgery

## 2016-05-17 DIAGNOSIS — M5416 Radiculopathy, lumbar region: Secondary | ICD-10-CM

## 2016-05-30 ENCOUNTER — Other Ambulatory Visit: Payer: Medicare Other

## 2016-06-06 ENCOUNTER — Inpatient Hospital Stay: Admission: RE | Admit: 2016-06-06 | Payer: Medicare Other | Source: Ambulatory Visit

## 2016-06-29 ENCOUNTER — Ambulatory Visit
Admission: RE | Admit: 2016-06-29 | Discharge: 2016-06-29 | Disposition: A | Discharge status: Home / Self Care | Payer: Medicare Other | Source: Ambulatory Visit | Attending: Neurosurgery | Admitting: Neurosurgery

## 2016-06-29 DIAGNOSIS — M5416 Radiculopathy, lumbar region: Secondary | ICD-10-CM

## 2016-07-23 ENCOUNTER — Encounter: Payer: Self-pay | Admitting: Internal Medicine

## 2016-07-23 ENCOUNTER — Ambulatory Visit (INDEPENDENT_AMBULATORY_CARE_PROVIDER_SITE_OTHER): Payer: Medicare Other | Admitting: Internal Medicine

## 2016-07-23 VITALS — BP 150/100 | HR 84 | Temp 97.8°F | Ht 73.0 in | Wt 214.0 lb

## 2016-07-23 DIAGNOSIS — M5432 Sciatica, left side: Secondary | ICD-10-CM | POA: Diagnosis not present

## 2016-07-23 DIAGNOSIS — M153 Secondary multiple arthritis: Secondary | ICD-10-CM

## 2016-07-23 DIAGNOSIS — E7800 Pure hypercholesterolemia, unspecified: Secondary | ICD-10-CM

## 2016-07-23 DIAGNOSIS — I10 Essential (primary) hypertension: Secondary | ICD-10-CM

## 2016-07-23 DIAGNOSIS — M1A40X0 Other secondary chronic gout, unspecified site, without tophus (tophi): Secondary | ICD-10-CM

## 2016-07-23 DIAGNOSIS — M1992 Post-traumatic osteoarthritis, unspecified site: Secondary | ICD-10-CM | POA: Diagnosis not present

## 2016-07-23 DIAGNOSIS — K219 Gastro-esophageal reflux disease without esophagitis: Secondary | ICD-10-CM | POA: Diagnosis not present

## 2016-07-23 DIAGNOSIS — L409 Psoriasis, unspecified: Secondary | ICD-10-CM

## 2016-07-23 DIAGNOSIS — G4733 Obstructive sleep apnea (adult) (pediatric): Secondary | ICD-10-CM

## 2016-07-23 DIAGNOSIS — E785 Hyperlipidemia, unspecified: Secondary | ICD-10-CM | POA: Insufficient documentation

## 2016-07-23 NOTE — Assessment & Plan Note (Signed)
He is not sure what his triggers are Given history of PUD, he should continue Prilosec indefinitely Will monitor

## 2016-07-23 NOTE — Assessment & Plan Note (Signed)
He will continue Allopurinol and Colchicine for now Uric acid from 2013 reviewed.

## 2016-07-23 NOTE — Assessment & Plan Note (Signed)
No lipid profile to review Encouraged him to consume a low fat diet Continue Zocor and Fenofibrate for now
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
## 2016-07-23 NOTE — Assessment & Plan Note (Signed)
Continue hydrocortisone prn

## 2016-07-23 NOTE — Assessment & Plan Note (Signed)
He is not using a CPAP machine at this time Will monitor

## 2016-07-23 NOTE — Assessment & Plan Note (Signed)
Uncontrolled but because this is the first time I have met him, I will not adjust his BP Continue Norvasc, Losartan and Toprol Kidney function reviewed

## 2016-07-23 NOTE — Assessment & Plan Note (Signed)
Advised him to take Aleve daily

## 2016-07-23 NOTE — Patient Instructions (Signed)
Sciatica Sciatica is pain, numbness, weakness, or tingling along the path of the sciatic nerve. The sciatic nerve starts in the lower back and runs down the back of each leg. The nerve controls the muscles in the lower leg and in the back of the knee. It also provides feeling (sensation) to the back of the thigh, the lower leg, and the sole of the foot. Sciatica is a symptom of another medical condition that pinches or puts pressure on the sciatic nerve. Generally, sciatica only affects one side of the body. Sciatica usually goes away on its own or with treatment. In some cases, sciatica may keep coming back (recur). What are the causes? This condition is caused by pressure on the sciatic nerve, or pinching of the sciatic nerve. This may be the result of:  A disk in between the bones of the spine (vertebrae) bulging out too far (herniated disk).  Age-related changes in the spinal disks (degenerative disk disease).  A pain disorder that affects a muscle in the buttock (piriformis syndrome).  Extra bone growth (bone spur) near the sciatic nerve.  An injury or break (fracture) of the pelvis.  Pregnancy.  Tumor (rare). What increases the risk? The following factors may make you more likely to develop this condition:  Playing sports that place pressure or stress on the spine, such as football or weight lifting.  Having poor strength and flexibility.  A history of back injury.  A history of back surgery.  Sitting for long periods of time.  Doing activities that involve repetitive bending or lifting.  Obesity. What are the signs or symptoms? Symptoms can vary from mild to very severe, and they may include:  Any of these problems in the lower back, leg, hip, or buttock:  Mild tingling or dull aches.  Burning sensations.  Sharp pains.  Numbness in the back of the calf or the sole of the foot.  Leg weakness.  Severe back pain that makes movement difficult. These symptoms may  get worse when you cough, sneeze, or laugh, or when you sit or stand for long periods of time. Being overweight may also make symptoms worse. In some cases, symptoms may recur over time. How is this diagnosed? This condition may be diagnosed based on:  Your symptoms.  A physical exam. Your health care provider may ask you to do certain movements to check whether those movements trigger your symptoms.  You may have tests, including:  Blood tests.  X-rays.  MRI.  CT scan. How is this treated? In many cases, this condition improves on its own, without any treatment. However, treatment may include:  Reducing or modifying physical activity during periods of pain.  Exercising and stretching to strengthen your abdomen and improve the flexibility of your spine.  Icing and applying heat to the affected area.  Medicines that help:  To relieve pain and swelling.  To relax your muscles.  Injections of medicines that help to relieve pain, irritation, and inflammation around the sciatic nerve (steroids).  Surgery. Follow these instructions at home: Medicines   Take over-the-counter and prescription medicines only as told by your health care provider.  Do not drive or operate heavy machinery while taking prescription pain medicine. Managing pain   If directed, apply ice to the affected area.  Put ice in a plastic bag.  Place a towel between your skin and the bag.  Leave the ice on for 20 minutes, 2-3 times a day.  After icing, apply heat to the   affected area before you exercise or as often as told by your health care provider. Use the heat source that your health care provider recommends, such as a moist heat pack or a heating pad.  Place a towel between your skin and the heat source.  Leave the heat on for 20-30 minutes.  Remove the heat if your skin turns bright red. This is especially important if you are unable to feel pain, heat, or cold. You may have a greater risk of  getting burned. Activity   Return to your normal activities as told by your health care provider. Ask your health care provider what activities are safe for you.  Avoid activities that make your symptoms worse.  Take brief periods of rest throughout the day. Resting in a lying or standing position is usually better than sitting to rest.  When you rest for longer periods, mix in some mild activity or stretching between periods of rest. This will help to prevent stiffness and pain.  Avoid sitting for long periods of time without moving. Get up and move around at least one time each hour.  Exercise and stretch regularly, as told by your health care provider.  Do not lift anything that is heavier than 10 lb (4.5 kg) while you have symptoms of sciatica. When you do not have symptoms, you should still avoid heavy lifting, especially repetitive heavy lifting.  When you lift objects, always use proper lifting technique, which includes:  Bending your knees.  Keeping the load close to your body.  Avoiding twisting. General instructions   Use good posture.  Avoid leaning forward while sitting.  Avoid hunching over while standing.  Maintain a healthy weight. Excess weight puts extra stress on your back and makes it difficult to maintain good posture.  Wear supportive, comfortable shoes. Avoid wearing high heels.  Avoid sleeping on a mattress that is too soft or too hard. A mattress that is firm enough to support your back when you sleep may help to reduce your pain.  Keep all follow-up visits as told by your health care provider. This is important. Contact a health care provider if:  You have pain that wakes you up when you are sleeping.  You have pain that gets worse when you lie down.  Your pain is worse than you have experienced in the past.  Your pain lasts longer than 4 weeks.  You experience unexplained weight loss. Get help right away if:  You lose control of your bowel  or bladder (incontinence).  You have:  Weakness in your lower back, pelvis, buttocks, or legs that gets worse.  Redness or swelling of your back.  A burning sensation when you urinate. This information is not intended to replace advice given to you by your health care provider. Make sure you discuss any questions you have with your health care provider. Document Released: 07/16/2001 Document Revised: 12/26/2015 Document Reviewed: 03/31/2015 Elsevier Interactive Patient Education  2017 [employer]  

## 2016-08-26 NOTE — Pre-Procedure Instructions (Signed)
    Venancio PoissonDanny C Rafanan  08/26/2016       Your procedure is scheduled on Tuesday, January 30.  [facility] at 6:00 AM                Your surgery or procedure is scheduled for 8:00 AM    Call this number if you have problems the morning of [phone]    Remember:  Do not eat food or drink liquids after midnight Monday, January 29.  Take these medicines the morning of surgery with A SIP OF WATER : amLODipine (NORVASC), fenofibrate.               Take if needed:  Colchicine.   Do not wear jewelry, make-up or nail polish.  Do not wear lotions, powders, or perfumes, or deodorant.              Men may shave face and neck.  Do not bring valuables to the hospital.  [facility] is not responsible for any belongings or valuables.  Contacts, dentures or bridgework may not be worn into surgery.  Leave your suitcase in the car.  After surgery it may be brought to your room.  For patients admitted to the hospital, discharge time will be determined by your treatment team.  Patients discharged the day of surgery will not be allowed to drive home.   Name and phone number of your driver:-  Please read over the following fact sheets that you were given: Coughing and Deep Breathing

## 2016-08-26 NOTE — Pre-Procedure Instructions (Signed)
[name]  08/26/2016      [facility] [address] - [place], [place] - [name] [place] [id number] Phone: [phone] Fax: [phone]    Your procedure is scheduled on January 30  [facility] at 0600 A.M.  Call this number if you have problems the morning of surgery:  [phone]   Remember:  Do not eat food or drink liquids after midnight.   Take these medicines the morning of surgery with A SIP OF WATER amLODipine (NORVASC), colchicine,   7 days prior to surgery STOP taking any Aspirin, Aleve, Naproxen, Ibuprofen, Motrin, Advil, Goody's, BC's, all herbal medications, fish oil, and all vitamins     Do not wear jewelry.  Do not wear lotions, powders, or cologne, or deoderant.  Men may shave face and neck.  Do not bring valuables to the hospital.  [facility] is not responsible for any belongings or valuables.  Contacts, dentures or bridgework may not be worn into surgery.  Leave your suitcase in the car.  After surgery it may be brought to your room.  For patients admitted to the hospital, discharge time will be determined by your treatment team.  Patients discharged the day of surgery will not be allowed to drive home.    Special instructions:   - Preparing For Surgery  Before surgery, you can play an important role. Because skin is not sterile, your skin needs to be as free of germs as possible. You can reduce the number of germs on your skin by washing with CHG (chlorahexidine gluconate) Soap before surgery.  CHG is an antiseptic cleaner which kills germs and bonds with the skin to continue killing germs even after washing.  Please do not use if you have an allergy to CHG or antibacterial soaps. If your skin becomes reddened/irritated stop using the CHG.  Do not shave (including legs and underarms) for at least 48 hours prior to first CHG shower. It is OK to shave your face.  Please follow these  instructions carefully.   1. Shower the NIGHT BEFORE SURGERY and the MORNING OF SURGERY with CHG.   2. If you chose to wash your hair, wash your hair first as usual with your normal shampoo.  3. After you shampoo, rinse your hair and body thoroughly to remove the shampoo.  4. Use CHG as you would any other liquid soap. You can apply CHG directly to the skin and wash gently with a scrungie or a clean washcloth.   5. Apply the CHG Soap to your body ONLY FROM THE NECK DOWN.  Do not use on open wounds or open sores. Avoid contact with your eyes, ears, mouth and genitals (private parts). Wash genitals (private parts) with your normal soap.  6. Wash thoroughly, paying special attention to the area where your surgery will be performed.  7. Thoroughly rinse your body with warm water from the neck down.  8. DO NOT shower/wash with your normal soap after using and rinsing off the CHG Soap.  9. Pat yourself dry with a CLEAN TOWEL.   10. Wear CLEAN PAJAMAS   11. Place CLEAN SHEETS on your bed the night of your first shower and DO NOT SLEEP WITH PETS.    Day of Surgery: Do not apply any deodorants/lotions. Please wear clean clothes to the hospital/surgery center.      Please read over the following fact sheets that you were given.

## 2016-08-27 ENCOUNTER — Encounter (HOSPITAL_COMMUNITY)
Admission: RE | Admit: 2016-08-27 | Discharge: 2016-08-27 | Disposition: A | Discharge status: Home / Self Care | Payer: Medicare Other | Source: Ambulatory Visit | Attending: Neurosurgery | Admitting: Neurosurgery

## 2016-08-27 ENCOUNTER — Encounter (HOSPITAL_COMMUNITY): Payer: Self-pay

## 2016-08-27 DIAGNOSIS — Z01812 Encounter for preprocedural laboratory examination: Secondary | ICD-10-CM | POA: Diagnosis not present

## 2016-08-27 DIAGNOSIS — Z01810 Encounter for preprocedural cardiovascular examination: Secondary | ICD-10-CM | POA: Insufficient documentation

## 2016-08-27 HISTORY — DX: Major depressive disorder, single episode, unspecified: F32.9

## 2016-08-27 HISTORY — DX: Other injury of unspecified body region, initial encounter: T14.8XXA

## 2016-08-27 HISTORY — DX: Depression, unspecified: F32.A

## 2016-08-27 LAB — BASIC METABOLIC PANEL
ANION GAP: 7 (ref 5–15)
CO2: 25 mmol/L (ref 22–32)
Calcium: 9.7 mg/dL (ref 8.9–10.3)
Chloride: 100 mmol/L — ABNORMAL LOW (ref 101–111)
Creatinine, Ser: 0.84 mg/dL (ref 0.61–1.24)
Glucose, Bld: 197 mg/dL — ABNORMAL HIGH (ref 65–99)
POTASSIUM: 3.8 mmol/L (ref 3.5–5.1)
SODIUM: 132 mmol/L — AB (ref 135–145)

## 2016-08-27 LAB — CBC
HEMATOCRIT: 45.9 % (ref 39.0–52.0)
HEMOGLOBIN: 15.8 g/dL (ref 13.0–17.0)
MCH: 30.3 pg (ref 26.0–34.0)
MCHC: 34.4 g/dL (ref 30.0–36.0)
MCV: 87.9 fL (ref 78.0–100.0)
Platelets: 200 10*3/uL (ref 150–400)
RBC: 5.22 MIL/uL (ref 4.22–5.81)
RDW: 14.1 % (ref 11.5–15.5)
WBC: 6.6 10*3/uL (ref 4.0–10.5)

## 2016-09-03 ENCOUNTER — Ambulatory Visit (HOSPITAL_COMMUNITY): Payer: Medicare Other

## 2016-09-03 ENCOUNTER — Ambulatory Visit (HOSPITAL_COMMUNITY)
Admission: RE | Admit: 2016-09-03 | Discharge: 2016-09-03 | Disposition: A | Discharge status: Home / Self Care | Payer: Medicare Other | Source: Ambulatory Visit | Attending: Neurosurgery | Admitting: Neurosurgery

## 2016-09-03 ENCOUNTER — Encounter (HOSPITAL_COMMUNITY)
Admission: RE | Disposition: A | Discharge status: Home / Self Care | Payer: Self-pay | Source: Ambulatory Visit | Attending: Neurosurgery

## 2016-09-03 ENCOUNTER — Ambulatory Visit (HOSPITAL_COMMUNITY): Payer: Medicare Other | Admitting: Certified Registered Nurse Anesthetist

## 2016-09-03 ENCOUNTER — Encounter (HOSPITAL_COMMUNITY): Payer: Self-pay | Admitting: Certified Registered Nurse Anesthetist

## 2016-09-03 DIAGNOSIS — M5127 Other intervertebral disc displacement, lumbosacral region: Secondary | ICD-10-CM | POA: Diagnosis not present

## 2016-09-03 DIAGNOSIS — F329 Major depressive disorder, single episode, unspecified: Secondary | ICD-10-CM | POA: Diagnosis not present

## 2016-09-03 DIAGNOSIS — Z8719 Personal history of other diseases of the digestive system: Secondary | ICD-10-CM | POA: Diagnosis not present

## 2016-09-03 DIAGNOSIS — M48061 Spinal stenosis, lumbar region without neurogenic claudication: Secondary | ICD-10-CM | POA: Insufficient documentation

## 2016-09-03 DIAGNOSIS — G4730 Sleep apnea, unspecified: Secondary | ICD-10-CM | POA: Insufficient documentation

## 2016-09-03 DIAGNOSIS — F17210 Nicotine dependence, cigarettes, uncomplicated: Secondary | ICD-10-CM | POA: Diagnosis not present

## 2016-09-03 DIAGNOSIS — M109 Gout, unspecified: Secondary | ICD-10-CM | POA: Diagnosis not present

## 2016-09-03 DIAGNOSIS — L409 Psoriasis, unspecified: Secondary | ICD-10-CM | POA: Insufficient documentation

## 2016-09-03 DIAGNOSIS — M1990 Unspecified osteoarthritis, unspecified site: Secondary | ICD-10-CM | POA: Insufficient documentation

## 2016-09-03 DIAGNOSIS — E785 Hyperlipidemia, unspecified: Secondary | ICD-10-CM | POA: Diagnosis not present

## 2016-09-03 DIAGNOSIS — I10 Essential (primary) hypertension: Secondary | ICD-10-CM | POA: Insufficient documentation

## 2016-09-03 DIAGNOSIS — Z79899 Other long term (current) drug therapy: Secondary | ICD-10-CM | POA: Insufficient documentation

## 2016-09-03 DIAGNOSIS — K219 Gastro-esophageal reflux disease without esophagitis: Secondary | ICD-10-CM | POA: Diagnosis not present

## 2016-09-03 DIAGNOSIS — M5416 Radiculopathy, lumbar region: Secondary | ICD-10-CM

## 2016-09-03 HISTORY — PX: RADIOLOGY WITH ANESTHESIA: SHX6223

## 2016-09-03 SURGERY — RADIOLOGY WITH ANESTHESIA
Anesthesia: General

## 2016-09-03 MED ORDER — FENTANYL CITRATE (PF) 100 MCG/2ML IJ SOLN
INTRAMUSCULAR | Status: DC | PRN
  Administered 2016-09-03: 1000 ug via INTRAVENOUS

## 2016-09-03 MED ORDER — MIDAZOLAM HCL 2 MG/2ML IJ SOLN
INTRAMUSCULAR | Status: DC | PRN
  Administered 2016-09-03: 2 mg via INTRAVENOUS

## 2016-09-03 MED ORDER — LACTATED RINGERS IV SOLN
Freq: Once | INTRAVENOUS | Status: AC
  Administered 2016-09-03: 08:00:00 via INTRAVENOUS

## 2016-09-03 MED ORDER — GADOBENATE DIMEGLUMINE 529 MG/ML IV SOLN
20.0000 mL | Freq: Once | INTRAVENOUS | Status: AC
  Administered 2016-09-03: 19 mL via INTRAVENOUS

## 2016-09-03 NOTE — Anesthesia Procedure Notes (Signed)
Procedures

## 2016-09-03 NOTE — Anesthesia Postprocedure Evaluation (Addendum)
Anesthesia Post Note  Patient: Venancio PoissonDanny C Veras  Procedure(s) Performed: Procedure(s) (LRB): MRI LUMBER SPINE WITH AND WITHOUT CONSTRAST (N/A)  Patient location during evaluation: PACU Anesthesia Type: General Level of consciousness: awake and alert Pain management: pain level controlled Vital Signs Assessment: post-procedure vital signs reviewed and stable Respiratory status: spontaneous breathing, nonlabored ventilation, respiratory function stable and patient connected to nasal cannula oxygen Cardiovascular status: blood pressure returned to baseline and stable Postop Assessment: no signs of nausea or vomiting Anesthetic complications: no       Last Vitals:  Vitals:   09/03/16 1030 09/03/16 1045  BP:    Pulse: 74 73  Resp: 17 18  Temp:      Last Pain:  Vitals:   09/03/16 1045  PainSc: 0-No pain                 Shelton SilvasKevin D Santo Zahradnik

## 2016-09-03 NOTE — Anesthesia Procedure Notes (Signed)
Procedure Name: Intubation Date/Time: 09/03/2016 8:30 AM Performed by: Salli Quarry Oneita Allmon Pre-anesthesia Checklist: Patient identified, Emergency Drugs available, Suction available and Patient being monitored Patient Re-evaluated:Patient Re-evaluated prior to inductionOxygen Delivery Method: Circle System Utilized Preoxygenation: Pre-oxygenation with 100% oxygen Intubation Type: IV induction Laryngoscope Size: Mac and 4 Grade View: Grade II Tube type: Oral Tube size: 7.0 mm Number of attempts: 1 Airway Equipment and Method: Stylet and Oral airway Placement Confirmation: ETT inserted through vocal cords under direct vision,  positive ETCO2 and breath sounds checked- equal and bilateral Secured at: 23 cm Tube secured with: Tape Dental Injury: Teeth and Oropharynx as per pre-operative assessment
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
## 2016-09-03 NOTE — Anesthesia Preprocedure Evaluation (Addendum)
Anesthesia Evaluation  Patient identified by MRN, date of birth, ID band Patient awake    Reviewed: Allergy & Precautions, NPO status , Patient's Chart, lab work & pertinent test results  Airway Mallampati: II  TM Distance: >3 FB Neck ROM: Full    Dental  (+) Upper Dentures, Lower Dentures   Pulmonary sleep apnea , Current Smoker,   Distant   + decreased breath sounds      Cardiovascular hypertension, Pt. on medications  Rhythm:Regular Rate:Normal     Neuro/Psych  Headaches, PSYCHIATRIC DISORDERS Depression    GI/Hepatic Neg liver ROS, PUD, GERD  ,  Endo/Other  negative endocrine ROS  Renal/GU negative Renal ROS  negative genitourinary   Musculoskeletal  (+) Arthritis ,   Abdominal   Peds negative pediatric ROS (+)  Hematology negative hematology ROS (+)   Anesthesia Other Findings - HLD  Reproductive/Obstetrics negative OB ROS                           Lab Results  Component Value Date   WBC 6.6 08/27/2016   HGB 15.8 08/27/2016   HCT 45.9 08/27/2016   MCV 87.9 08/27/2016   PLT 200 08/27/2016   Lab Results  Component Value Date   CREATININE 0.84 08/27/2016   BUN <5 (L) 08/27/2016   NA 132 (L) 08/27/2016   K 3.8 08/27/2016   CL 100 (L) 08/27/2016   CO2 25 08/27/2016   No results found for: INR, PROTIME  EKG: normal sinus rhythm.  Anesthesia Physical Anesthesia Plan  ASA: II  Anesthesia Plan: General   Post-op Pain Management:    Induction: Intravenous  Airway Management Planned: Oral ETT  Additional Equipment:   Intra-op Plan:   Post-operative Plan: Extubation in OR  Informed Consent: I have reviewed the patients History and Physical, chart, labs and discussed the procedure including the risks, benefits and alternatives for the proposed anesthesia with the patient or authorized representative who has indicated his/her understanding and acceptance.   Dental  advisory given  Plan Discussed with: CRNA  Anesthesia Plan Comments:         Anesthesia Quick Evaluation

## 2016-09-03 NOTE — Transfer of Care (Signed)
Immediate Anesthesia Transfer of Care Note  Patient: [name]  Procedure(s) Performed: Procedure(s): MRI LUMBER SPINE WITH AND WITHOUT CONSTRAST (N/A)  Patient Location: PACU  Anesthesia Type:General  Level of Consciousness: awake, alert , oriented and patient cooperative  Airway & Oxygen Therapy: Patient Spontanous Breathing  Post-op Assessment: Report given to RN and Post -op Vital signs reviewed and stable  Post vital signs: Reviewed and stable  Last Vitals:  Vitals:   09/03/16 0632 09/03/16 0722  BP: (!) 152/107 (!) 139/103  Pulse: 76   Resp: 20   Temp: 36.5 C     Last Pain:  Vitals:   09/03/16 0722  PainSc: 6       Patients Stated Pain Goal: 2 (09/03/16 [id number])  Complications: No apparent anesthesia complications

## 2016-09-04 ENCOUNTER — Encounter (HOSPITAL_COMMUNITY): Payer: Self-pay | Admitting: Radiology

## 2016-09-04 MED FILL — Ondansetron HCl Inj 4 MG/2ML (2 MG/ML): INTRAMUSCULAR | Qty: 2 | Status: AC

## 2016-09-04 MED FILL — Succinylcholine Chloride Inj 20 MG/ML: INTRAMUSCULAR | Qty: 6 | Status: AC

## 2016-09-04 MED FILL — Midazolam HCl Inj 2 MG/2ML (Base Equivalent): INTRAMUSCULAR | Qty: 2 | Status: AC

## 2016-09-04 MED FILL — Rocuronium Bromide IV Soln 50 MG/5ML (10 MG/ML): INTRAVENOUS | Qty: 5 | Status: AC

## 2016-09-04 MED FILL — Phenylephrine-NaCl Pref Syr 0.4 MG/10ML-0.9% (40 MCG/ML): INTRAVENOUS | Qty: 10 | Status: AC

## 2016-09-04 MED FILL — Propofol IV Emul 200 MG/20ML (10 MG/ML): INTRAVENOUS | Qty: 20 | Status: AC

## 2016-09-04 MED FILL — Lidocaine HCl Local Soln Prefilled Syringe 100 MG/5ML (2%): INTRAMUSCULAR | Qty: 5 | Status: AC

## 2016-09-04 MED FILL — Fentanyl Citrate Preservative Free (PF) Inj 100 MCG/2ML: INTRAMUSCULAR | Qty: 2 | Status: AC

## 2016-09-04 MED FILL — Lactated Ringer's Solution: INTRAVENOUS | Qty: 1000 | Status: AC

## 2016-09-04 MED FILL — Ephedrine Sulf-NaCl Soln Pref Syr 50 MG/10ML-0.9% (5 MG/ML): INTRAVENOUS | Qty: 10 | Status: AC

## 2016-09-04 MED FILL — Sugammadex Sodium IV 200 MG/2ML (Base Equivalent): INTRAVENOUS | Qty: 2 | Status: AC

## 2016-09-05 ENCOUNTER — Telehealth: Payer: Self-pay | Admitting: Internal Medicine

## 2016-09-05 NOTE — Telephone Encounter (Signed)
Pt called back, CMA not available, read verbatim the result notes from PCP, pt did not have any questions, he has Dr. Page Spiroramer's phone number and will be calling him to get a consultation appointment.  Pt will call us back if needed.  He states he will call us for an appointment after he sees Dr. Remigio Eisenmengerramer.

## 2016-09-05 NOTE — Telephone Encounter (Signed)
noted 

## 2016-09-05 NOTE — Telephone Encounter (Signed)
Patient returned Melanie's call. °

## 2016-10-10 ENCOUNTER — Other Ambulatory Visit: Payer: Self-pay | Admitting: Neurosurgery

## 2016-10-10 DIAGNOSIS — M5137 Other intervertebral disc degeneration, lumbosacral region: Secondary | ICD-10-CM

## 2016-10-23 ENCOUNTER — Other Ambulatory Visit: Payer: Medicare Other

## 2016-10-24 ENCOUNTER — Inpatient Hospital Stay
Admission: RE | Admit: 2016-10-24 | Discharge: 2016-10-24 | Disposition: A | Discharge status: Home / Self Care | Payer: Medicare Other | Source: Ambulatory Visit | Attending: Neurosurgery | Admitting: Neurosurgery

## 2016-11-21 ENCOUNTER — Ambulatory Visit
Admission: RE | Admit: 2016-11-21 | Discharge: 2016-11-21 | Disposition: A | Discharge status: Home / Self Care | Payer: Medicare Other | Source: Ambulatory Visit | Attending: Neurosurgery | Admitting: Neurosurgery

## 2016-11-21 DIAGNOSIS — M5137 Other intervertebral disc degeneration, lumbosacral region: Secondary | ICD-10-CM

## 2016-11-21 MED ORDER — METHYLPREDNISOLONE ACETATE 40 MG/ML INJ SUSP (RADIOLOG
120.0000 mg | Freq: Once | INTRAMUSCULAR | Status: AC
  Administered 2016-11-21: 120 mg via EPIDURAL

## 2016-11-21 MED ORDER — IOPAMIDOL (ISOVUE-M 200) INJECTION 41%
1.0000 mL | Freq: Once | INTRAMUSCULAR | Status: AC
  Administered 2016-11-21: 1 mL via EPIDURAL

## 2016-11-21 NOTE — Discharge Instructions (Signed)

## 2017-01-03 NOTE — Addendum Note (Signed)
Addendum  created 01/03/17 0949 by Tamyra Fojtik D, MD   Sign clinical note    

## 2017-01-20 ENCOUNTER — Other Ambulatory Visit: Payer: Self-pay

## 2017-01-20 MED ORDER — LOSARTAN POTASSIUM 100 MG PO TABS: 100.0000 mg | ORAL_TABLET | Freq: Every day | ORAL | 0 refills | Status: DC

## 2017-01-20 NOTE — Telephone Encounter (Signed)
Pt established care with Pamala Hurry Baity NP on 07/23/16 and was advised to continue BP meds. Pt has been out of Losartan for 2 days. Pt has appt with Pamala Hurry Baity NP on 01/23/17. Pt request at least # 30 for ins purposes. Refilled # 30 x 0 and pt will keep appt on 01/23/17. [facility].

## 2017-01-23 ENCOUNTER — Ambulatory Visit (INDEPENDENT_AMBULATORY_CARE_PROVIDER_SITE_OTHER)
Admission: RE | Admit: 2017-01-23 | Discharge: 2017-01-23 | Disposition: A | Discharge status: Home / Self Care | Payer: Medicare Other | Source: Ambulatory Visit | Attending: Internal Medicine | Admitting: Internal Medicine

## 2017-01-23 ENCOUNTER — Other Ambulatory Visit: Payer: Self-pay | Admitting: Internal Medicine

## 2017-01-23 ENCOUNTER — Encounter: Payer: Self-pay | Admitting: Internal Medicine

## 2017-01-23 ENCOUNTER — Ambulatory Visit (INDEPENDENT_AMBULATORY_CARE_PROVIDER_SITE_OTHER): Payer: Medicare Other | Admitting: Internal Medicine

## 2017-01-23 VITALS — BP 140/92 | HR 86 | Temp 98.1°F | Ht 73.0 in | Wt 202.8 lb

## 2017-01-23 DIAGNOSIS — M153 Secondary multiple arthritis: Secondary | ICD-10-CM

## 2017-01-23 DIAGNOSIS — Z1211 Encounter for screening for malignant neoplasm of colon: Secondary | ICD-10-CM

## 2017-01-23 DIAGNOSIS — E7800 Pure hypercholesterolemia, unspecified: Secondary | ICD-10-CM

## 2017-01-23 DIAGNOSIS — Z1159 Encounter for screening for other viral diseases: Secondary | ICD-10-CM

## 2017-01-23 DIAGNOSIS — L409 Psoriasis, unspecified: Secondary | ICD-10-CM

## 2017-01-23 DIAGNOSIS — F17200 Nicotine dependence, unspecified, uncomplicated: Secondary | ICD-10-CM

## 2017-01-23 DIAGNOSIS — M1A00X0 Idiopathic chronic gout, unspecified site, without tophus (tophi): Secondary | ICD-10-CM | POA: Diagnosis not present

## 2017-01-23 DIAGNOSIS — K219 Gastro-esophageal reflux disease without esophagitis: Secondary | ICD-10-CM

## 2017-01-23 DIAGNOSIS — Z23 Encounter for immunization: Secondary | ICD-10-CM | POA: Diagnosis not present

## 2017-01-23 DIAGNOSIS — Z125 Encounter for screening for malignant neoplasm of prostate: Secondary | ICD-10-CM

## 2017-01-23 DIAGNOSIS — G4733 Obstructive sleep apnea (adult) (pediatric): Secondary | ICD-10-CM | POA: Diagnosis not present

## 2017-01-23 DIAGNOSIS — Z0000 Encounter for general adult medical examination without abnormal findings: Secondary | ICD-10-CM | POA: Diagnosis not present

## 2017-01-23 DIAGNOSIS — Z114 Encounter for screening for human immunodeficiency virus [HIV]: Secondary | ICD-10-CM

## 2017-01-23 DIAGNOSIS — I10 Essential (primary) hypertension: Secondary | ICD-10-CM

## 2017-01-23 DIAGNOSIS — M1992 Post-traumatic osteoarthritis, unspecified site: Secondary | ICD-10-CM

## 2017-01-23 DIAGNOSIS — J449 Chronic obstructive pulmonary disease, unspecified: Secondary | ICD-10-CM

## 2017-01-23 LAB — COMPREHENSIVE METABOLIC PANEL
ALBUMIN: 4.5 g/dL (ref 3.5–5.2)
ALK PHOS: 151 U/L — AB (ref 39–117)
ALT: 81 U/L — AB (ref 0–53)
AST: 96 U/L — AB (ref 0–37)
BILIRUBIN TOTAL: 0.7 mg/dL (ref 0.2–1.2)
BUN: 8 mg/dL (ref 6–23)
CALCIUM: 10.4 mg/dL (ref 8.4–10.5)
CO2: 28 mEq/L (ref 19–32)
CREATININE: 0.88 mg/dL (ref 0.40–1.50)
Chloride: 98 mEq/L (ref 96–112)
GFR: 96.24 mL/min (ref >60.00)
Glucose, Bld: 385 mg/dL — ABNORMAL HIGH (ref 70–99)
Potassium: 4.6 mEq/L (ref 3.5–5.1)
Sodium: 135 mEq/L (ref 135–145)
Total Protein: 7.6 g/dL (ref 6.0–8.3)

## 2017-01-23 LAB — PSA, MEDICARE: PSA: 2.35 ng/mL (ref 0.10–4.00)

## 2017-01-23 LAB — CBC
HCT: 43.5 % (ref 39.0–52.0)
HEMOGLOBIN: 14.8 g/dL (ref 13.0–17.0)
MCHC: 33.9 g/dL (ref 30.0–36.0)
MCV: 91.7 fl (ref 78.0–100.0)
PLATELETS: 165 10*3/uL (ref 150.0–400.0)
RBC: 4.74 Mil/uL (ref 4.22–5.81)
RDW: 13.8 % (ref 11.5–15.5)
WBC: 6.5 10*3/uL (ref 4.0–10.5)

## 2017-01-23 LAB — LIPID PANEL
CHOLESTEROL: 245 mg/dL — AB (ref 0–200)
HDL: 82.8 mg/dL (ref >39.00)
LDL Cholesterol: 143 mg/dL — ABNORMAL HIGH (ref 0–99)
NonHDL: 162.27
TRIGLYCERIDES: 95 mg/dL (ref 0.0–149.0)
Total CHOL/HDL Ratio: 3
VLDL: 19 mg/dL (ref 0.0–40.0)

## 2017-01-23 LAB — HEMOGLOBIN A1C: Hgb A1c MFr Bld: 11.8 % — ABNORMAL HIGH (ref 4.6–6.5)

## 2017-01-23 LAB — URIC ACID: URIC ACID, SERUM: 3.8 mg/dL — AB (ref 4.0–7.8)

## 2017-01-23 MED ORDER — FLUTICASONE PROPIONATE (INHAL) 50 MCG/BLIST IN AEPB: 1.0000 | INHALATION_SPRAY | Freq: Two times a day (BID) | RESPIRATORY_TRACT | 12 refills | Status: DC

## 2017-01-23 MED ORDER — ALLOPURINOL 300 MG PO TABS: 300.0000 mg | ORAL_TABLET | Freq: Every day | ORAL | 11 refills | Status: DC

## 2017-01-23 MED ORDER — COLCHICINE 0.6 MG PO TABS: 0.6000 mg | ORAL_TABLET | Freq: Every day | ORAL | 5 refills | Status: DC | PRN

## 2017-01-23 MED ORDER — TETANUS-DIPHTHERIA TOXOIDS TD 2-2 LF/0.5ML IM SUSP: 0.5000 mL | Freq: Once | INTRAMUSCULAR | 0 refills | Status: AC

## 2017-01-23 NOTE — Assessment & Plan Note (Signed)
Uric acid level today Allopurinol and Colchicine refilled today

## 2017-01-23 NOTE — Assessment & Plan Note (Signed)
He refuses CPAP machine Will monitor for now

## 2017-01-23 NOTE — Addendum Note (Signed)
Addended by: Roena MaladyEVONTENNO, Hildred Mollica Y on: 01/23/2017 11:43 AM   Modules accepted: Orders

## 2017-01-23 NOTE — Assessment & Plan Note (Signed)
Advised him to continue Prilosec Wean not indicated at this time, especially with his NSAID use

## 2017-01-23 NOTE — Assessment & Plan Note (Signed)
Continue Hydrocortisone prn

## 2017-01-23 NOTE — Assessment & Plan Note (Signed)
Elevated today I question his medication compliance Can consider changing Losartan to Losartan HCT in addition to Amlodipine CMET today

## 2017-01-23 NOTE — Assessment & Plan Note (Signed)
Encouraged him to stay active Continue Aleve and ASA prn

## 2017-01-23 NOTE — Patient Instructions (Signed)
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
## 2017-01-23 NOTE — Addendum Note (Signed)
Addended by: Roena MaladyEVONTENNO, Shawnika Pepin Y on: 01/23/2017 11:57 AM   Modules accepted: Orders

## 2017-01-23 NOTE — Assessment & Plan Note (Signed)
CMET and Lipid profile today Encouraged him to consume a low fat diet Advised him to start taking his Zocor and Fenofibrate as prescribed

## 2017-01-24 LAB — HEPATITIS C ANTIBODY: HCV Ab: NEGATIVE

## 2017-01-24 LAB — HIV ANTIBODY (ROUTINE TESTING W REFLEX): HIV 1&2 Ab, 4th Generation: NONREACTIVE

## 2017-02-13 ENCOUNTER — Ambulatory Visit (INDEPENDENT_AMBULATORY_CARE_PROVIDER_SITE_OTHER): Payer: Medicare Other | Admitting: Internal Medicine

## 2017-02-13 ENCOUNTER — Encounter: Payer: Self-pay | Admitting: Internal Medicine

## 2017-02-13 DIAGNOSIS — E1165 Type 2 diabetes mellitus with hyperglycemia: Secondary | ICD-10-CM | POA: Diagnosis not present

## 2017-02-13 DIAGNOSIS — J449 Chronic obstructive pulmonary disease, unspecified: Secondary | ICD-10-CM | POA: Diagnosis not present

## 2017-02-13 DIAGNOSIS — I10 Essential (primary) hypertension: Secondary | ICD-10-CM

## 2017-02-13 DIAGNOSIS — E119 Type 2 diabetes mellitus without complications: Secondary | ICD-10-CM | POA: Insufficient documentation

## 2017-02-13 MED ORDER — GLIPIZIDE 10 MG PO TABS: 10.0000 mg | ORAL_TABLET | Freq: Two times a day (BID) | ORAL | 2 refills | Status: DC

## 2017-02-13 MED ORDER — SIMVASTATIN 20 MG PO TABS: 20.0000 mg | ORAL_TABLET | Freq: Every day | ORAL | 2 refills | Status: DC
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
## 2017-02-13 NOTE — Assessment & Plan Note (Signed)
He will continue Flovent Discussed trying Wellbutrin but he does not want any more medicines at this time Discussed trying to wean down weekly Handout given on smoking cessation

## 2017-02-13 NOTE — Patient Instructions (Signed)
Diabetes Mellitus and Standards of Medical Care Managing diabetes (diabetes mellitus) can be complicated. Your diabetes treatment may be managed by a team of health care providers, including:  A diet and nutrition specialist (registered dietitian).  A nurse.  A certified diabetes educator (CDE).  A diabetes specialist (endocrinologist).  An eye doctor.  A primary care provider.  A dentist.  Your health care providers follow a schedule in order to help you get the best quality of care. The following schedule is a general guideline for your diabetes management plan. Your health care providers may also give you more specific instructions. HbA1c ( hemoglobin A1c) test This test provides information about blood sugar (glucose) control over the previous 2-3 months. It is used to check whether your diabetes management plan needs to be adjusted.  If you are meeting your treatment goals, this test is done at least 2 times a year.  If you are not meeting treatment goals or if your treatment goals have changed, this test is done 4 times a year.  Blood pressure test  This test is done at every routine medical visit. For most people, the goal is less than 130/80. Ask your health care provider what your goal blood pressure should be. Dental and eye exams  Visit your dentist two times a year.  If you have type 1 diabetes, get an eye exam 3-5 years after you are diagnosed, and then once a year after your first exam. ? If you were diagnosed with type 1 diabetes as a child, get an eye exam when you are age 16 or older and have had diabetes for 3-5 years. After the first exam, you should get an eye exam once a year.  If you have type 2 diabetes, have an eye exam as soon as you are diagnosed, and then once a year after your first exam. Foot care exam  Visual foot exams are done at every routine medical visit. The exams check for cuts, bruises, redness, blisters, sores, or other problems with the  feet.  A complete foot exam is done by your health care provider once a year. This exam includes an inspection of the structure and skin of your feet, and a check of the pulses and sensation in your feet. ? Type 1 diabetes: Get your first exam 3-5 years after diagnosis. ? Type 2 diabetes: Get your first exam as soon as you are diagnosed.  Check your feet every day for cuts, bruises, redness, blisters, or sores. If you have any of these or other problems that are not healing, contact your health care provider. Kidney function test ( urine microalbumin)  This test is done once a year. ? Type 1 diabetes: Get your first test 5 years after diagnosis. ? Type 2 diabetes: Get your first test as soon as you are diagnosed.  If you have chronic kidney disease (CKD), get a serum creatinine and estimated glomerular filtration rate (eGFR) test once a year. Lipid profile (cholesterol, HDL, LDL, triglycerides)  This test should be done when you are diagnosed with diabetes, and every 5 years after the first test. If you are on medicines to lower your cholesterol, you may need to get this test done every year. ? The goal for LDL is less than 100 mg/dL (5.5 mmol/L). If you are at high risk, the goal is less than 70 mg/dL (3.9 mmol/L). ? The goal for HDL is 40 mg/dL (2.2 mmol/L) for men and 50 mg/dL(2.8 mmol/L) for women. An HDL  cholesterol of 60 mg/dL (3.3 mmol/L) or higher gives some protection against heart disease. ? The goal for triglycerides is less than 150 mg/dL (8.3 mmol/L). Immunizations  The yearly flu (influenza) vaccine is recommended for everyone 6 months or older who has diabetes.  The pneumonia (pneumococcal) vaccine is recommended for everyone 2 years or older who has diabetes. If you are 97 or older, you may get the pneumonia vaccine as a series of two separate shots.  The hepatitis B vaccine is recommended for adults shortly after they have been diagnosed with diabetes.  The Tdap  (tetanus, diphtheria, and pertussis) vaccine should be given: ? According to normal childhood vaccination schedules, for children. ? Every 10 years, for adults who have diabetes.  The shingles vaccine is recommended for people who have had chicken pox and are 50 years or older. Mental and emotional health  Screening for symptoms of eating disorders, anxiety, and depression is recommended at the time of diagnosis and afterward as needed. If your screening shows that you have symptoms (you have a positive screening result), you may need further evaluation and be referred to a mental health care provider. Diabetes self-management education  Education about how to manage your diabetes is recommended at diagnosis and ongoing as needed. Treatment plan  Your treatment plan will be reviewed at every medical visit. Summary  Managing diabetes (diabetes mellitus) can be complicated. Your diabetes treatment may be managed by a team of health care providers.  Your health care providers follow a schedule in order to help you get the best quality of care.  Standards of care including having regular physical exams, blood tests, blood pressure monitoring, immunizations, screening tests, and education about how to manage your diabetes.  Your health care providers may also give you more specific instructions based on your individual health. This information is not intended to replace advice given to you by your health care provider. Make sure you discuss any questions you have with your health care provider. Document Released: 05/19/2009 Document Revised: 04/19/2016 Document Reviewed: 04/19/2016 Elsevier Interactive Patient Education  [employer].

## 2017-02-13 NOTE — Assessment & Plan Note (Signed)
He has not been taking his meds I printed out his medications and wrote down what each one was for We discussed the importance of taking the Losartan and Amlodipine every day  RTC in 2 weeks for BP check

## 2017-02-13 NOTE — Assessment & Plan Note (Signed)
New diagnosis Discussed diabetes and standards of medical care He declines diabetes education and nutrition at this time eRx for Glipizide 10 mg BID We will not start having him check his sugars at this time Foot exam today Discussed the importance of yearly eye exams Will discuss immunization at next visit, he seems very overwhelmed today

## 2017-02-16 ENCOUNTER — Other Ambulatory Visit: Payer: Self-pay | Admitting: Internal Medicine

## 2017-02-17 MED ORDER — AMLODIPINE BESYLATE 10 MG PO TABS: 10.0000 mg | ORAL_TABLET | Freq: Every day | ORAL | 0 refills | Status: DC

## 2017-02-27 ENCOUNTER — Ambulatory Visit (INDEPENDENT_AMBULATORY_CARE_PROVIDER_SITE_OTHER): Payer: Medicare Other | Admitting: Internal Medicine

## 2017-02-27 ENCOUNTER — Encounter: Payer: Self-pay | Admitting: Internal Medicine

## 2017-02-27 ENCOUNTER — Encounter (INDEPENDENT_AMBULATORY_CARE_PROVIDER_SITE_OTHER): Payer: Self-pay

## 2017-02-27 DIAGNOSIS — I10 Essential (primary) hypertension: Secondary | ICD-10-CM

## 2017-02-27 NOTE — Assessment & Plan Note (Signed)
Improved Continue Amlodipine and Losartan  Will monitor for now  RTC in 2 months, follow up DM 2

## 2017-02-27 NOTE — Patient Instructions (Signed)
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
## 2017-03-19 ENCOUNTER — Other Ambulatory Visit: Payer: Self-pay | Admitting: Internal Medicine

## 2017-03-25 ENCOUNTER — Telehealth: Payer: Self-pay | Admitting: Internal Medicine

## 2017-03-25 NOTE — Telephone Encounter (Signed)
Pt reports he can not come in until next week... appt scheduled for Mon at 11:15... Pt has continued to take medication... Advised pt to try warm salt water gargles... And possibly an OTC allergy meds, pt stated he does not have allergies.... Please advise if further instruction is needed prior to appt [facility]

## 2017-03-25 NOTE — Telephone Encounter (Signed)
I do not want him to stop Glipizide. I want him to make an appt to discuss

## 2017-03-25 NOTE — Telephone Encounter (Signed)
Patient Name: [name] DOB: 09-06-1965 Initial Comment Caller states his medication is giving him a sore throat. Nurse Assessment Nurse: Fransisco Hertz, RN, Elnita Maxwell Date/Time Lamount Cohen Time): 03/25/2017 1:47:55 PM Confirm and document reason for call. If symptomatic, describe symptoms. ---Caller states his medication is giving him a sore throat. Glipizide 10mg  po BID has been giving him a raw, sore throat for about a month. The pamphlet states in side effects that came from pharmacy. Does the patient have any new or worsening symptoms? ---Yes Will a triage be completed? ---Yes Related visit to physician within the last 2 weeks? ---No Does the PT have any chronic conditions? (i.e. diabetes, asthma, etc.) ---Yes List chronic conditions. ---diabetes Is the patient pregnant or possibly pregnant? (Ask all females between the ages of 69-55) ---No Is this a behavioral health or substance abuse call? ---No Guidelines Guideline Title Affirmed Question Affirmed Notes Sore Throat [1] Sore throat with cough/cold symptoms AND [2] present < 5 days Final Disposition User [facility] [place], RN, [place] Disagree/Comply: Comply

## 2017-03-26 NOTE — Telephone Encounter (Signed)
Continue as is until appt

## 2017-03-31 ENCOUNTER — Encounter: Payer: Self-pay | Admitting: Internal Medicine

## 2017-03-31 ENCOUNTER — Ambulatory Visit (INDEPENDENT_AMBULATORY_CARE_PROVIDER_SITE_OTHER): Payer: Medicare Other | Admitting: Internal Medicine

## 2017-03-31 VITALS — BP 128/84 | HR 57 | Temp 98.0°F | Wt 211.0 lb

## 2017-03-31 DIAGNOSIS — T887XXA Unspecified adverse effect of drug or medicament, initial encounter: Secondary | ICD-10-CM

## 2017-03-31 DIAGNOSIS — E1165 Type 2 diabetes mellitus with hyperglycemia: Secondary | ICD-10-CM

## 2017-03-31 DIAGNOSIS — J029 Acute pharyngitis, unspecified: Secondary | ICD-10-CM | POA: Diagnosis not present

## 2017-03-31 MED ORDER — METFORMIN HCL 1000 MG PO TABS: ORAL_TABLET | ORAL | 3 refills | Status: DC

## 2017-03-31 NOTE — Patient Instructions (Signed)
Basics of Medicine Management What should I do when I am taking medicines?  Read all of the labels and the inserts that come with your medicines. Review the information often.  Talk with your pharmacist if you notice a change in the size, color, or shape of your medicines.  Try to get all of your medicines at one pharmacy. The pharmacist will have all your information and will understand possible drug interactions.  Ask your health care provider any questions that you have about your prescribed medicines and any over-the-counter medicines, vitamins, and herbal or dietary supplements that you take. It is important to make sure that nothing will interact with any of your prescribed medicines. What should I know about my medicines?  Know the potential side effects for each medicine that you take.  Know what each of your medicines looks like. This includes size, color, and shape. ? If you are getting confused and having trouble recognizing your different medicines, ask your health care provider or pharmacist about changing your medicines or helping you to identify them more easily. How can I take my medicines safely?  Take medicines only as directed by your health care provider. ? Do not take more of your medicine than instructed. ? Do not take anyone else's medicines. ? Do not share your medicines with other people. ? Do not stop taking your medicines unless you have talked about that with your health care provider. ? You may need to avoid alcohol or certain foods or liquids with one or more of your medicines. Follow your health care provider's instructions.  Do not split, mash, or chew your medicines unless your health care provider tells you to do so. Tell your health care provider if you have trouble swallowing your medicines.  For every liquid medicine, use the dosing container that was provided. How should I organized my medicines?  Use a tool, such as a weekly pillbox, a written  chart from your health care provider, a notebook, or your own calendar to organize your medicine schedule.  If you have trouble recognizing your different medicines, keep them in their original bottles.  Create reminders for taking your medicines. Use sticky notes, or use alarms on your watch, mobile device, or phone calendar.  Your organization system should help you to remember the following information about each medicine: ? Name of the medicine. ? Dosage. ? Schedule. This includes the day and time when it should be taken. ? Appearance. This includes color, shape, size, and stamp. ? How to take your medicines. You may need to take them with or without certain foods, on an empty stomach, with fluids, or by following some other instruction.  More advanced medicine management systems are also available. These offer weekly or monthly options that are complete with storage, alarms, and visual and audio prompts.  Review your medicine schedule with a family member, friend, or caregiver. Other household members should understand your medicines.  If you have trouble reading the names of your different medicines, ask your pharmacist to provide your medicines in containers with large print.  If you take any medicines on an "as needed" basis, such as medicines for nausea or pain, it is important that you remember what you have taken and when you did so. Write down the following information each time you take an "as needed" medicine: the name, the dosage, and the date and time that you took it. How should I plan ahead for travel?  Take your pillbox, medicines, and organization   system with you when you travel.  Have your medicines refilled before you leave for travel. This will ensure that you do not run out of your medicines while you are away from home.  Always carry an updated list of your medicines with you. If there is an emergency, a respondent can quickly see what medicines you are taking. How  should I store and discard my medicines?  Store medicines in a cool, dry area away from light or as directed by your pharmacist or health care provider. The bathroom is not a good place for medicine storage because of heat and humidity.  Store your medicines away from chemicals, medicines for your pet, and medicines of other household members.  Keep medicines where children cannot reach them. Do not leave them on counters or bedside tables. Store them in high cabinets or on high shelves.  Check expiration dates regularly. Do not take expired medicines. Discard medicines that are older than the expiration date.  Learn about the best way to dispose of each medicine that you take. Find out if your local government recycling program, hospital, or pharmacy has a medicine take-back program for safe disposal. If not, some medicines may be mixed with inedible substances and thrown away in the trash in a sealed bag or empty container. What should I remember?  Tell your health care provider if you experience side effects, you have new symptoms, or you have other concerns. There may be dosing changes or alternative medicines that would be better for you.  Review your medicines regularly with your health care provider. Ask if you need to continue to take each medicine, and discuss how well each one is working. Medicines, diet, medical conditions, weight changes, and other habits can all affect how medicines work.  Refill your medicines early so that you do not risk running out.  In case of an accidental overdose, call your local Poison Control Center at 1-800-222-1222 or visit your local emergency department immediately. This is important. What should I know about giving medicines to my child?  Use positive reinforcement to help your child take necessary medicines. Try singing, cuddling, and rewards.  Use only the syringes, droppers, dosing spoons, or dosing cups from your child's health care provider or  pharmacist.  Always wash your hands before giving medicines.  Learn about the medicine policies at your child's school. ? Meet with the school nurse to review your child's medicine schedule in detail. ? Do not send oral medicines to school with your child.  If your child has trouble taking medicine, forgets a dose, or spits it up, talk with his or her health care provider.  Do not give over-the-counter cough and cold medicines to your child who is younger than 2 years old, unless directed by his or her health care provider.  Do not give your child aspirin unless instructed to do so by your child's pediatrician or cardiologist.  Make sure that your child knows how to use an inhaler properly, if needed. This information is not intended to replace advice given to you by your health care provider. Make sure you discuss any questions you have with your health care provider. Document Released: 11/06/2010 Document Revised: 04/03/2016 Document Reviewed: 03/24/2014 Elsevier Interactive Patient Education  2018 [employer]  
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
## 2017-04-04 ENCOUNTER — Other Ambulatory Visit: Payer: Self-pay | Admitting: Internal Medicine

## 2017-05-01 ENCOUNTER — Ambulatory Visit: Payer: Medicare Other | Admitting: Internal Medicine

## 2017-05-02 ENCOUNTER — Other Ambulatory Visit: Payer: Self-pay | Admitting: Internal Medicine

## 2017-05-21 ENCOUNTER — Other Ambulatory Visit: Payer: Self-pay | Admitting: Internal Medicine

## 2017-05-26 ENCOUNTER — Encounter: Payer: Self-pay | Admitting: Internal Medicine

## 2017-05-26 ENCOUNTER — Ambulatory Visit: Payer: Medicare Other | Admitting: Internal Medicine

## 2017-05-26 ENCOUNTER — Ambulatory Visit (INDEPENDENT_AMBULATORY_CARE_PROVIDER_SITE_OTHER): Payer: Medicare Other | Admitting: Internal Medicine

## 2017-05-26 VITALS — BP 138/82 | HR 77 | Temp 97.9°F | Wt 215.0 lb

## 2017-05-26 DIAGNOSIS — R748 Abnormal levels of other serum enzymes: Secondary | ICD-10-CM

## 2017-05-26 DIAGNOSIS — E119 Type 2 diabetes mellitus without complications: Secondary | ICD-10-CM | POA: Diagnosis not present

## 2017-05-26 DIAGNOSIS — R109 Unspecified abdominal pain: Secondary | ICD-10-CM

## 2017-05-26 DIAGNOSIS — E7800 Pure hypercholesterolemia, unspecified: Secondary | ICD-10-CM

## 2017-05-26 LAB — POC URINALSYSI DIPSTICK (AUTOMATED)
Bilirubin, UA: NEGATIVE
Blood, UA: NEGATIVE
KETONES UA: NEGATIVE
LEUKOCYTES UA: NEGATIVE
Nitrite, UA: NEGATIVE
PROTEIN UA: NEGATIVE
SPEC GRAV UA: 1.015 (ref 1.010–1.025)
UROBILINOGEN UA: 0.2 U/dL
pH, UA: 6 (ref 5.0–8.0)

## 2017-05-26 LAB — COMPREHENSIVE METABOLIC PANEL
ALT: 47 U/L (ref 0–53)
AST: 67 U/L — AB (ref 0–37)
Albumin: 4.1 g/dL (ref 3.5–5.2)
Alkaline Phosphatase: 122 U/L — ABNORMAL HIGH (ref 39–117)
BILIRUBIN TOTAL: 0.6 mg/dL (ref 0.2–1.2)
BUN: 5 mg/dL — AB (ref 6–23)
CO2: 27 meq/L (ref 19–32)
CREATININE: 0.76 mg/dL (ref 0.40–1.50)
Calcium: 9.8 mg/dL (ref 8.4–10.5)
Chloride: 96 mEq/L (ref 96–112)
GFR: 113.83 mL/min (ref >60.00)
GLUCOSE: 227 mg/dL — AB (ref 70–99)
Potassium: 4.1 mEq/L (ref 3.5–5.1)
SODIUM: 132 meq/L — AB (ref 135–145)
TOTAL PROTEIN: 8.2 g/dL (ref 6.0–8.3)

## 2017-05-26 LAB — LIPID PANEL
CHOL/HDL RATIO: 4
Cholesterol: 191 mg/dL (ref 0–200)
HDL: 47 mg/dL (ref >39.00)
LDL Cholesterol: 120 mg/dL — ABNORMAL HIGH (ref 0–99)
NONHDL: 144.2
Triglycerides: 119 mg/dL (ref 0.0–149.0)
VLDL: 23.8 mg/dL (ref 0.0–40.0)

## 2017-05-26 LAB — HEMOGLOBIN A1C: HEMOGLOBIN A1C: 8.4 % — AB (ref 4.6–6.5)

## 2017-05-26 NOTE — Assessment & Plan Note (Signed)
CMET and lipid profile today Encouraged him to consume a low fat diet Continue Simvastatin and Fenofibrate for now

## 2017-05-26 NOTE — Patient Instructions (Signed)
How to Avoid Diabetes Mellitus Problems You can take action to prevent or slow down problems that are caused by diabetes (diabetes mellitus). Following your diabetes plan and taking care of yourself can reduce your risk of serious or life-threatening complications. Manage your diabetes  Follow instructions from your health care providers about managing your diabetes. Your diabetes may be managed by a team of health care providers who can teach you how to care for yourself and can answer questions that you have.  Educate yourself about your condition so you can make healthy choices about eating and physical activity.  Check your blood sugar (glucose) levels as often as directed. Your health care provider will help you decide how often to check your blood glucose level depending on your treatment goals and how well you are meeting them.  Ask your health care provider if you should take low-dose aspirin daily and what dose is recommended for you. Taking low-dose aspirin daily is recommended to help prevent cardiovascular disease. Do not use nicotine or tobacco Do not use any products that contain nicotine or tobacco, such as cigarettes and e-cigarettes. If you need help quitting, ask your health care provider. Nicotine raises your risk for diabetes problems. If you quit using nicotine:  You will lower your risk for heart attack, stroke, nerve disease, and kidney disease.  Your cholesterol and blood pressure may improve.  Your blood circulation will improve.  Keep your blood pressure under control To control your blood pressure:  Follow instructions from your health care provider about meal planning, exercise, and medicines.  Make sure your health care provider checks your blood pressure at every medical visit.  A blood pressure reading consists of two numbers. Generally, the goal is to keep your top number (systolic pressure) at or below 130, and your bottom number (diastolic pressure) at or  below 80. Your health care provider may recommend a lower target blood pressure. Your individualized target blood pressure is determined based on:  Your age.  Your medicines.  How long you have had diabetes.  Any other medical conditions you have.  Keep your cholesterol under control To control your cholesterol:  Follow instructions from your health care provider about meal planning, exercise, and medicines.  Have your cholesterol checked at least once a year.  You may be prescribed medicine to lower cholesterol (statin). If you are not taking a statin, ask your health care provider if you should be.  Controlling your cholesterol may:  Help prevent heart disease and stroke. These are the most common health problems for people with diabetes.  Improve your blood flow.  Schedule and keep yearly physical exams and eye exams Your health care provider will tell you how often you need medical visits depending on your diabetes management plan. Keep all follow-up visits as directed. This is important so possible problems can be identified early and complications can be avoided or treated.  Every visit with your health care provider should include measuring your: ? Weight. ? Blood pressure. ? Blood glucose control.  Your A1c (hemoglobin A1c) level should be checked: ? At least 2 times a year, if you are meeting your treatment goals. ? 4 times a year, if you are not meeting treatment goals or if your treatment goals have changed.  Your blood lipids (lipid profile) should be checked yearly. You should also be checked yearly for protein in your urine (urine microalbumin).  If you have type 1 diabetes, get an eye exam 3-5 years after you  are diagnosed, and then once a year after your first exam.  If you have type 2 diabetes, get an eye exam as soon as you are diagnosed, and then once a year after your first exam.  Keep your vaccines current It is recommended that you  receive:  pneumonia (pneumococcal) vaccine and a hepatitis B vaccine. If you are age 53 or older, you may get the pneumonia vaccine as a series of two separate shots.  Ask your health care provider which other vaccines may be recommended. Take care of your feet Diabetes may cause you to have poor blood circulation to your legs and feet. Because of this, taking care of your feet is very important. Diabetes can cause:  The skin on the feet to get thinner, break more easily, and heal more slowly.  Nerve damage in your legs and feet, which results in decreased feeling. You may not notice minor injuries that could lead to serious problems.  To avoid foot problems:  Check your skin and feet every day for cuts, bruises, redness, blisters, or sores.  Schedule a foot exam with your health care provider once every year. This exam includes: ? Inspecting of the structure and skin of your feet. ? Checking the pulses and sensation in your feet.  Make sure that your health care provider performs a visual foot exam at every medical visit.  Take care of your teeth People with poorly controlled diabetes are more likely to have gum (periodontal) disease. Diabetes can make periodontal diseases harder to control. If not treated, periodontal diseases can lead to tooth loss. To prevent this:  Brush your teeth twice a day.  Floss at least once a day.  Visit your dentist 2 times a year.  Drink responsibly Limit alcohol intake to no more than 1 drink a day for nonpregnant women and 2 drinks a day for men. One drink equals 12 oz of beer, 5 oz of wine, or 1 oz of hard liquor. It is important to eat food when you drink alcohol to avoid low blood glucose (hypoglycemia). Avoid alcohol if you:  Have a history of alcohol abuse or dependence.  Are pregnant.  Have liver disease, pancreatitis, advanced neuropathy, or severe hypertriglyceridemia.  Lessen stress Living with diabetes can be stressful. When you  are experiencing stress, your blood glucose may be affected in two ways:  Stress hormones may cause your blood glucose to rise.  You may be distracted from taking good care of yourself.  Be aware of your stress level and make changes to help you manage challenging situations. To lower your stress levels:  Consider joining a support group.  Do planned relaxation or meditation.  Do a hobby that you enjoy.  Maintain healthy relationships.  Exercise regularly.  Work with your health care provider or a mental health professional.  Summary  You can take action to prevent or slow down problems that are caused by diabetes (diabetes mellitus). Following your diabetes plan and taking care of yourself can reduce your risk of serious or life-threatening complications.  Follow instructions from your health care providers about managing your diabetes. Your diabetes may be managed by a team of health care providers who can teach you how to care for yourself and can answer questions that you have.  Your health care provider will tell you how often you need medical visits depending on your diabetes management plan. Keep all follow-up visits as directed. This is important so possible problems can be identified early and  complications can be avoided or treated. This information is not intended to replace advice given to you by your health care provider. Make sure you discuss any questions you have with your health care provider. Document Released: 04/09/2011 Document Revised: 04/20/2016 Document Reviewed: 04/20/2016 Elsevier Interactive Patient Education  [employer].
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
## 2017-05-26 NOTE — Assessment & Plan Note (Signed)
A1C today No microalbumin secondary to ARB therapy Encouraged him to consume a low carb, low fat diet and exercise for weight loss Consider switching Metformin to Glipizide (he reports Metformin makes him feel weak) Offered a referral for opthalmology for eye exam, he declines Foot exam today He declines flu shot Pneumovax UTD

## 2017-05-26 NOTE — Addendum Note (Signed)
Addended by: Roena MaladyEVONTENNO, Aury Scollard Y on: 05/26/2017 04:49 PM   Modules accepted: Orders

## 2017-05-30 ENCOUNTER — Other Ambulatory Visit: Payer: Self-pay | Admitting: Internal Medicine

## 2017-05-30 MED ORDER — SIMVASTATIN 40 MG PO TABS: 40.0000 mg | ORAL_TABLET | Freq: Every day | ORAL | 3 refills | Status: DC

## 2017-05-30 NOTE — Addendum Note (Signed)
Addended by: Roena MaladyEVONTENNO, MELANIE Y on: 05/30/2017 03:39 PM   Modules accepted: Orders

## 2017-06-18 ENCOUNTER — Ambulatory Visit: Admission: RE | Admit: 2017-06-18 | Payer: Medicare Other | Source: Ambulatory Visit | Admitting: Internal Medicine

## 2017-06-18 ENCOUNTER — Encounter: Admission: RE | Payer: Self-pay | Source: Ambulatory Visit

## 2017-06-18 SURGERY — COLONOSCOPY WITH PROPOFOL
Anesthesia: General

## 2017-06-22 ENCOUNTER — Other Ambulatory Visit: Payer: Self-pay | Admitting: Internal Medicine

## 2017-06-27 ENCOUNTER — Other Ambulatory Visit: Payer: Self-pay | Admitting: Internal Medicine

## 2017-08-26 ENCOUNTER — Ambulatory Visit (INDEPENDENT_AMBULATORY_CARE_PROVIDER_SITE_OTHER): Payer: Medicare Other | Admitting: Internal Medicine

## 2017-08-26 ENCOUNTER — Encounter: Payer: Self-pay | Admitting: Internal Medicine

## 2017-08-26 VITALS — BP 128/84 | HR 75 | Temp 97.9°F | Wt 217.0 lb

## 2017-08-26 DIAGNOSIS — E1165 Type 2 diabetes mellitus with hyperglycemia: Secondary | ICD-10-CM | POA: Diagnosis not present

## 2017-08-26 DIAGNOSIS — E7800 Pure hypercholesterolemia, unspecified: Secondary | ICD-10-CM

## 2017-08-26 LAB — COMPREHENSIVE METABOLIC PANEL
ALT: 42 U/L (ref 0–53)
AST: 48 U/L — ABNORMAL HIGH (ref 0–37)
Albumin: 4.1 g/dL (ref 3.5–5.2)
Alkaline Phosphatase: 134 U/L — ABNORMAL HIGH (ref 39–117)
BUN: 6 mg/dL (ref 6–23)
CHLORIDE: 94 meq/L — AB (ref 96–112)
CO2: 27 meq/L (ref 19–32)
Calcium: 9.5 mg/dL (ref 8.4–10.5)
Creatinine, Ser: 0.73 mg/dL (ref 0.40–1.50)
GFR: 119.13 mL/min (ref >60.00)
GLUCOSE: 334 mg/dL — AB (ref 70–99)
POTASSIUM: 4 meq/L (ref 3.5–5.1)
SODIUM: 130 meq/L — AB (ref 135–145)
Total Bilirubin: 1.1 mg/dL (ref 0.2–1.2)
Total Protein: 8.2 g/dL (ref 6.0–8.3)

## 2017-08-26 LAB — LIPID PANEL
Cholesterol: 187 mg/dL (ref 0–200)
HDL: 49.1 mg/dL (ref >39.00)
LDL CALC: 114 mg/dL — AB (ref 0–99)
NONHDL: 137.97
Total CHOL/HDL Ratio: 4
Triglycerides: 122 mg/dL (ref 0.0–149.0)
VLDL: 24.4 mg/dL (ref 0.0–40.0)

## 2017-08-26 LAB — HEMOGLOBIN A1C: HEMOGLOBIN A1C: 10 % — AB (ref 4.6–6.5)

## 2017-08-27 ENCOUNTER — Other Ambulatory Visit: Payer: Self-pay | Admitting: Internal Medicine

## 2017-08-27 ENCOUNTER — Ambulatory Visit: Admission: RE | Admit: 2017-08-27 | Payer: Medicare Other | Source: Ambulatory Visit | Admitting: Internal Medicine

## 2017-08-27 ENCOUNTER — Encounter: Admission: RE | Payer: Self-pay | Source: Ambulatory Visit

## 2017-08-27 HISTORY — DX: Chronic obstructive pulmonary disease, unspecified: J44.9

## 2017-08-27 SURGERY — COLONOSCOPY WITH PROPOFOL
Anesthesia: General

## 2017-08-27 MED ORDER — SITAGLIPTIN PHOSPHATE 50 MG PO TABS: 50.0000 mg | ORAL_TABLET | Freq: Every day | ORAL | 2 refills | Status: DC

## 2017-08-27 NOTE — Progress Notes (Signed)
jan

## 2017-08-29 ENCOUNTER — Encounter: Payer: Self-pay | Admitting: Internal Medicine

## 2017-08-29 NOTE — Assessment & Plan Note (Signed)
A1C and microalbumin today Encouraged him to consume a low carb diet and increase exercise D/C Metformin as he can not tolerate Continue Glipizide Will add Januvia if A1C > 8 Foot exam today Encouraged him to make an appt for an eye exam He declines flu shot, pneumovax UTD
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
## 2017-08-29 NOTE — Assessment & Plan Note (Signed)
CMET and lipid profile today Encouraged him to consume a low fat diet Continue Simvastatin for now 

## 2017-08-29 NOTE — Patient Instructions (Signed)

## 2017-09-05 ENCOUNTER — Other Ambulatory Visit: Payer: Self-pay

## 2017-09-05 MED ORDER — GLIPIZIDE 10 MG PO TABS: 10.0000 mg | ORAL_TABLET | Freq: Two times a day (BID) | ORAL | 2 refills | Status: DC

## 2017-11-13 ENCOUNTER — Other Ambulatory Visit (HOSPITAL_COMMUNITY): Payer: Self-pay | Admitting: Neurosurgery

## 2017-11-13 DIAGNOSIS — M5440 Lumbago with sciatica, unspecified side: Secondary | ICD-10-CM

## 2017-11-19 ENCOUNTER — Ambulatory Visit
Admission: RE | Admit: 2017-11-19 | Discharge: 2017-11-19 | Disposition: A | Discharge status: Home / Self Care | Payer: Medicare Other | Source: Ambulatory Visit | Attending: Neurosurgery | Admitting: Neurosurgery

## 2017-11-19 DIAGNOSIS — M5440 Lumbago with sciatica, unspecified side: Secondary | ICD-10-CM

## 2017-11-19 DIAGNOSIS — M48061 Spinal stenosis, lumbar region without neurogenic claudication: Secondary | ICD-10-CM | POA: Diagnosis not present

## 2017-11-19 DIAGNOSIS — E882 Lipomatosis, not elsewhere classified: Secondary | ICD-10-CM | POA: Diagnosis not present

## 2017-11-19 DIAGNOSIS — M8938 Hypertrophy of bone, other site: Secondary | ICD-10-CM | POA: Insufficient documentation

## 2017-11-19 DIAGNOSIS — M5127 Other intervertebral disc displacement, lumbosacral region: Secondary | ICD-10-CM | POA: Insufficient documentation

## 2017-11-19 DIAGNOSIS — M5126 Other intervertebral disc displacement, lumbar region: Secondary | ICD-10-CM | POA: Insufficient documentation

## 2017-11-19 LAB — POCT I-STAT CREATININE: CREATININE: 0.7 mg/dL (ref 0.61–1.24)

## 2017-11-19 MED ORDER — GADOBENATE DIMEGLUMINE 529 MG/ML IV SOLN
20.0000 mL | Freq: Once | INTRAVENOUS | Status: AC | PRN
  Administered 2017-11-19: 20 mL via INTRAVENOUS

## 2017-11-26 ENCOUNTER — Ambulatory Visit: Payer: Medicare Other | Admitting: Internal Medicine

## 2017-11-26 ENCOUNTER — Other Ambulatory Visit: Payer: Self-pay | Admitting: Internal Medicine

## 2017-12-14 ENCOUNTER — Other Ambulatory Visit: Payer: Self-pay | Admitting: Internal Medicine

## 2018-01-09 ENCOUNTER — Other Ambulatory Visit: Payer: Self-pay | Admitting: Internal Medicine

## 2018-01-27 ENCOUNTER — Ambulatory Visit (INDEPENDENT_AMBULATORY_CARE_PROVIDER_SITE_OTHER): Payer: Medicare Other | Admitting: Internal Medicine

## 2018-01-27 ENCOUNTER — Encounter: Payer: Self-pay | Admitting: Internal Medicine

## 2018-01-27 VITALS — BP 128/82 | HR 77 | Temp 98.0°F | Wt 203.0 lb

## 2018-01-27 DIAGNOSIS — E7800 Pure hypercholesterolemia, unspecified: Secondary | ICD-10-CM | POA: Diagnosis not present

## 2018-01-27 DIAGNOSIS — E1165 Type 2 diabetes mellitus with hyperglycemia: Secondary | ICD-10-CM

## 2018-01-27 DIAGNOSIS — G4733 Obstructive sleep apnea (adult) (pediatric): Secondary | ICD-10-CM | POA: Diagnosis not present

## 2018-01-27 DIAGNOSIS — G8929 Other chronic pain: Secondary | ICD-10-CM | POA: Diagnosis not present

## 2018-01-27 DIAGNOSIS — K219 Gastro-esophageal reflux disease without esophagitis: Secondary | ICD-10-CM | POA: Diagnosis not present

## 2018-01-27 DIAGNOSIS — I10 Essential (primary) hypertension: Secondary | ICD-10-CM | POA: Diagnosis not present

## 2018-01-27 DIAGNOSIS — M5442 Lumbago with sciatica, left side: Secondary | ICD-10-CM | POA: Diagnosis not present

## 2018-01-27 DIAGNOSIS — J449 Chronic obstructive pulmonary disease, unspecified: Secondary | ICD-10-CM

## 2018-01-27 DIAGNOSIS — M1A40X0 Other secondary chronic gout, unspecified site, without tophus (tophi): Secondary | ICD-10-CM | POA: Diagnosis not present

## 2018-01-27 DIAGNOSIS — M545 Low back pain: Secondary | ICD-10-CM

## 2018-01-27 LAB — COMPREHENSIVE METABOLIC PANEL
ALK PHOS: 143 U/L — AB (ref 39–117)
ALT: 46 U/L (ref 0–53)
AST: 51 U/L — ABNORMAL HIGH (ref 0–37)
Albumin: 4.4 g/dL (ref 3.5–5.2)
BUN: 7 mg/dL (ref 6–23)
CO2: 25 meq/L (ref 19–32)
Calcium: 9.9 mg/dL (ref 8.4–10.5)
Chloride: 95 mEq/L — ABNORMAL LOW (ref 96–112)
Creatinine, Ser: 0.75 mg/dL (ref 0.40–1.50)
GFR: 115.29 mL/min (ref >60.00)
GLUCOSE: 296 mg/dL — AB (ref 70–99)
POTASSIUM: 3.8 meq/L (ref 3.5–5.1)
SODIUM: 132 meq/L — AB (ref 135–145)
TOTAL PROTEIN: 8.5 g/dL — AB (ref 6.0–8.3)
Total Bilirubin: 1.1 mg/dL (ref 0.2–1.2)

## 2018-01-27 LAB — HEMOGLOBIN A1C: Hgb A1c MFr Bld: 11.2 % — ABNORMAL HIGH (ref 4.6–6.5)

## 2018-01-27 LAB — LIPID PANEL
Cholesterol: 223 mg/dL — ABNORMAL HIGH (ref 0–200)
HDL: 64.9 mg/dL (ref >39.00)
LDL CALC: 139 mg/dL — AB (ref 0–99)
NONHDL: 157.74
Total CHOL/HDL Ratio: 3
Triglycerides: 95 mg/dL (ref 0.0–149.0)
VLDL: 19 mg/dL (ref 0.0–40.0)

## 2018-01-27 MED ORDER — VARENICLINE TARTRATE 0.5 MG X 11 & 1 MG X 42 PO MISC: ORAL | 0 refills | Status: DC
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
## 2018-01-27 NOTE — Patient Instructions (Signed)
Coping with Quitting Smoking Quitting smoking is a physical and mental challenge. You will face cravings, withdrawal symptoms, and temptation. Before quitting, work with your health care provider to make a plan that can help you cope. Preparation can help you quit and keep you from giving in. How can I cope with cravings? Cravings usually last for 5-10 minutes. If you get through it, the craving will pass. Consider taking the following actions to help you cope with cravings:  Keep your mouth busy: ? Chew sugar-free gum. ? Suck on hard candies or a straw. ? Brush your teeth.  Keep your hands and body busy: ? Immediately change to a different activity when you feel a craving. ? Squeeze or play with a ball. ? Do an activity or a hobby, like making bead jewelry, practicing needlepoint, or working with wood. ? Mix up your normal routine. ? Take a short exercise break. Go for a quick walk or run up and down stairs. ? Spend time in public places where smoking is not allowed.  Focus on doing something kind or helpful for someone else.  Call a friend or family member to talk during a craving.  Join a support group.  Call a quit line, such as 1-800-QUIT-NOW.  Talk with your health care provider about medicines that might help you cope with cravings and make quitting easier for you.  How can I deal with withdrawal symptoms? Your body may experience negative effects as it tries to get used to not having nicotine in the system. These effects are called withdrawal symptoms. They may include:  Feeling hungrier than normal.  Trouble concentrating.  Irritability.  Trouble sleeping.  Feeling depressed.  Restlessness and agitation.  Craving a cigarette.  To manage withdrawal symptoms:  Avoid places, people, and activities that trigger your cravings.  Remember why you want to quit.  Get plenty of sleep.  Avoid coffee and other caffeinated drinks. These may worsen some of your  symptoms.  How can I handle social situations? Social situations can be difficult when you are quitting smoking, especially in the first few weeks. To manage this, you can:  Avoid parties, bars, and other social situations where people might be smoking.  Avoid alcohol.  Leave right away if you have the urge to smoke.  Explain to your family and friends that you are quitting smoking. Ask for understanding and support.  Plan activities with friends or family where smoking is not an option.  What are some ways I can cope with stress? Wanting to smoke may cause stress, and stress can make you want to smoke. Find ways to manage your stress. Relaxation techniques can help. For example:  Breathe slowly and deeply, in through your nose and out through your mouth.  Listen to soothing, relaxing music.  Talk with a family member or friend about your stress.  Light a candle.  Soak in a bath or take a shower.  Think about a peaceful place.  What are some ways I can prevent weight gain? Be aware that many people gain weight after they quit smoking. However, not everyone does. To keep from gaining weight, have a plan in place before you quit and stick to the plan after you quit. Your plan should include:  Having healthy snacks. When you have a craving, it may help to: ? Eat plain popcorn, crunchy carrots, celery, or other cut vegetables. ? Chew sugar-free gum.  Changing how you eat: ? Eat small portion sizes at meals. ?   Eat 4-6 small meals throughout the day instead of 1-2 large meals a day. ? Be mindful when you eat. Do not watch television or do other things that might distract you as you eat.  Exercising regularly: ? Make time to exercise each day. If you do not have time for a long workout, do short bouts of exercise for 5-10 minutes several times a day. ? Do some form of strengthening exercise, like weight lifting, and some form of aerobic exercise, like running or  swimming.  Drinking plenty of water or other low-calorie or no-calorie drinks. Drink 6-8 glasses of water daily, or as much as instructed by your health care provider.  Summary  Quitting smoking is a physical and mental challenge. You will face cravings, withdrawal symptoms, and temptation to smoke again. Preparation can help you as you go through these challenges.  You can cope with cravings by keeping your mouth busy (such as by chewing gum), keeping your body and hands busy, and making calls to family, friends, or a helpline for people who want to quit smoking.  You can cope with withdrawal symptoms by avoiding places where people smoke, avoiding drinks with caffeine, and getting plenty of rest.  Ask your health care provider about the different ways to prevent weight gain, avoid stress, and handle social situations. This information is not intended to replace advice given to you by your health care provider. Make sure you discuss any questions you have with your health care provider. Document Released: 07/19/2016 Document Revised: 07/19/2016 Document Reviewed: 07/19/2016 Elsevier Interactive Patient Education  2018 [employer]  

## 2018-01-29 DIAGNOSIS — G8929 Other chronic pain: Secondary | ICD-10-CM | POA: Insufficient documentation

## 2018-01-29 DIAGNOSIS — M549 Dorsalgia, unspecified: Secondary | ICD-10-CM

## 2018-01-29 NOTE — Assessment & Plan Note (Signed)
Controlled with prn Tums Will monitor

## 2018-01-29 NOTE — Assessment & Plan Note (Signed)
Controlled on Amlodipine and Losartan Reinforced DASH diet CBC and CMET today 

## 2018-01-29 NOTE — Assessment & Plan Note (Signed)
Encouraged him to use Flovent eRx for Chantix to assist with smoking cessation

## 2018-01-29 NOTE — Assessment & Plan Note (Signed)
CMET and Lipid profile today Encouraged him to consume a low fat diet Continue Simvastatin and Fenofibrate for now 

## 2018-01-29 NOTE — Assessment & Plan Note (Signed)
Currently not an issue Will monitor 

## 2018-01-29 NOTE — Assessment & Plan Note (Signed)
No recent flare on Allopurinol and Colchicine Will monitor

## 2018-01-29 NOTE — Assessment & Plan Note (Signed)
Referral placed to [facility] for second opinion

## 2018-01-29 NOTE — Assessment & Plan Note (Signed)
A1C today No microalbumin secondary to ARB therapy Encouraged him to consume a low carb diet, increase aerobic exercise Foot exam today Encouraged yearly eye exam Pneumovax UTD

## 2018-02-01 ENCOUNTER — Other Ambulatory Visit: Payer: Self-pay | Admitting: Internal Medicine

## 2018-02-12 MED ORDER — GLIPIZIDE 10 MG PO TABS: 10.0000 mg | ORAL_TABLET | Freq: Two times a day (BID) | ORAL | 3 refills | Status: DC

## 2018-02-12 MED ORDER — SIMVASTATIN 40 MG PO TABS
40.0000 mg | ORAL_TABLET | Freq: Every day | ORAL | 3 refills | Status: DC
Start: 2018-02-12 — End: 2018-06-11

## 2018-02-12 MED ORDER — SITAGLIPTIN PHOSPHATE 50 MG PO TABS: 50.0000 mg | ORAL_TABLET | Freq: Every day | ORAL | 3 refills | Status: DC

## 2018-02-12 MED ORDER — FENOFIBRATE 160 MG PO TABS: 160.0000 mg | ORAL_TABLET | Freq: Every day | ORAL | 3 refills | Status: DC

## 2018-02-12 NOTE — Addendum Note (Signed)
Addended by: Roena MaladyEVONTENNO, MELANIE Y on: 02/12/2018 04:20 PM   Modules accepted: Orders

## 2018-05-09 ENCOUNTER — Other Ambulatory Visit: Payer: Self-pay | Admitting: Internal Medicine

## 2018-05-16 ENCOUNTER — Other Ambulatory Visit: Payer: Self-pay | Admitting: Internal Medicine

## 2018-05-18 ENCOUNTER — Telehealth: Payer: Self-pay | Admitting: Internal Medicine

## 2018-05-18 NOTE — Telephone Encounter (Signed)
Spoke to pt to r/s appointment  Pt needs to get a refill on losartan [facility] Pt has 2 pill left

## 2018-05-18 NOTE — Telephone Encounter (Signed)
I have received request from pharmacy and has already been refilled

## 2018-05-19 ENCOUNTER — Ambulatory Visit: Payer: Medicare Other | Admitting: Internal Medicine

## 2018-05-19 ENCOUNTER — Other Ambulatory Visit: Payer: Self-pay | Admitting: Internal Medicine

## 2018-05-21 ENCOUNTER — Ambulatory Visit: Payer: Medicare Other | Admitting: Internal Medicine

## 2018-05-21 DIAGNOSIS — Z0289 Encounter for other administrative examinations: Secondary | ICD-10-CM
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
## 2018-06-11 ENCOUNTER — Encounter: Payer: Self-pay | Admitting: Internal Medicine

## 2018-06-11 ENCOUNTER — Other Ambulatory Visit: Payer: Self-pay | Admitting: Internal Medicine

## 2018-06-11 ENCOUNTER — Ambulatory Visit: Payer: Medicare Other | Admitting: Internal Medicine

## 2018-06-11 VITALS — BP 134/90 | HR 88 | Temp 98.0°F | Ht 73.0 in | Wt 214.0 lb

## 2018-06-11 DIAGNOSIS — M1A2210 Drug-induced chronic gout, right elbow, without tophus (tophi): Secondary | ICD-10-CM

## 2018-06-11 DIAGNOSIS — E1141 Type 2 diabetes mellitus with diabetic mononeuropathy: Secondary | ICD-10-CM | POA: Diagnosis not present

## 2018-06-11 DIAGNOSIS — M545 Low back pain, unspecified: Secondary | ICD-10-CM

## 2018-06-11 DIAGNOSIS — I10 Essential (primary) hypertension: Secondary | ICD-10-CM | POA: Diagnosis not present

## 2018-06-11 DIAGNOSIS — Z0000 Encounter for general adult medical examination without abnormal findings: Secondary | ICD-10-CM | POA: Diagnosis not present

## 2018-06-11 DIAGNOSIS — E782 Mixed hyperlipidemia: Secondary | ICD-10-CM

## 2018-06-11 DIAGNOSIS — Z125 Encounter for screening for malignant neoplasm of prostate: Secondary | ICD-10-CM | POA: Diagnosis not present

## 2018-06-11 DIAGNOSIS — J449 Chronic obstructive pulmonary disease, unspecified: Secondary | ICD-10-CM

## 2018-06-11 DIAGNOSIS — F339 Major depressive disorder, recurrent, unspecified: Secondary | ICD-10-CM

## 2018-06-11 DIAGNOSIS — K219 Gastro-esophageal reflux disease without esophagitis: Secondary | ICD-10-CM

## 2018-06-11 DIAGNOSIS — G8929 Other chronic pain: Secondary | ICD-10-CM

## 2018-06-11 DIAGNOSIS — G4733 Obstructive sleep apnea (adult) (pediatric): Secondary | ICD-10-CM

## 2018-06-11 LAB — LIPID PANEL
CHOLESTEROL: 198 mg/dL (ref 0–200)
HDL: 44 mg/dL (ref >39.00)
LDL Cholesterol: 121 mg/dL — ABNORMAL HIGH (ref 0–99)
NonHDL: 154.47
Total CHOL/HDL Ratio: 5
Triglycerides: 167 mg/dL — ABNORMAL HIGH (ref 0.0–149.0)
VLDL: 33.4 mg/dL (ref 0.0–40.0)

## 2018-06-11 LAB — HEMOGLOBIN A1C: Hgb A1c MFr Bld: 6.4 % (ref 4.6–6.5)

## 2018-06-11 LAB — CBC
HEMATOCRIT: 49.4 % (ref 39.0–52.0)
HEMOGLOBIN: 16.8 g/dL (ref 13.0–17.0)
MCHC: 34 g/dL (ref 30.0–36.0)
MCV: 91.5 fl (ref 78.0–100.0)
Platelets: 224 10*3/uL (ref 150.0–400.0)
RBC: 5.4 Mil/uL (ref 4.22–5.81)
RDW: 14 % (ref 11.5–15.5)
WBC: 7.2 10*3/uL (ref 4.0–10.5)

## 2018-06-11 LAB — COMPREHENSIVE METABOLIC PANEL
ALBUMIN: 4.3 g/dL (ref 3.5–5.2)
ALK PHOS: 128 U/L — AB (ref 39–117)
ALT: 29 U/L (ref 0–53)
AST: 49 U/L — ABNORMAL HIGH (ref 0–37)
BUN: 6 mg/dL (ref 6–23)
CO2: 26 mEq/L (ref 19–32)
Calcium: 9.7 mg/dL (ref 8.4–10.5)
Chloride: 99 mEq/L (ref 96–112)
Creatinine, Ser: 0.83 mg/dL (ref 0.40–1.50)
GFR: 102.42 mL/min (ref >60.00)
GLUCOSE: 140 mg/dL — AB (ref 70–99)
Potassium: 4.2 mEq/L (ref 3.5–5.1)
Sodium: 133 mEq/L — ABNORMAL LOW (ref 135–145)
TOTAL PROTEIN: 8.1 g/dL (ref 6.0–8.3)
Total Bilirubin: 0.7 mg/dL (ref 0.2–1.2)

## 2018-06-11 LAB — URIC ACID: Uric Acid, Serum: 5 mg/dL (ref 4.0–7.8)

## 2018-06-11 LAB — PSA: PSA: 2.72 ng/mL (ref 0.10–4.00)

## 2018-06-11 MED ORDER — SIMVASTATIN 40 MG PO TABS: 60.0000 mg | ORAL_TABLET | Freq: Every day | ORAL | 2 refills | Status: DC

## 2018-06-11 NOTE — Patient Instructions (Signed)
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
## 2018-06-16 ENCOUNTER — Encounter: Payer: Self-pay | Admitting: Internal Medicine

## 2018-06-16 DIAGNOSIS — F339 Major depressive disorder, recurrent, unspecified: Secondary | ICD-10-CM | POA: Insufficient documentation

## 2018-06-16 NOTE — Assessment & Plan Note (Signed)
Uric acid level today 

## 2018-06-16 NOTE — Assessment & Plan Note (Signed)
A1c today No microalbumin secondary to ARB therapy Continue Glipizide Encouraged him to consume a low carb diet and exercise for weight loss Encouraged yearly eye exam Foot exam today He declines flu shot Pneumovax up-to-date

## 2018-06-16 NOTE — Assessment & Plan Note (Signed)
C met and lipid profile today Encouraged him to consume a low-fat diet Continue Simvastatin and Fenofibrate for now

## 2018-06-16 NOTE — Assessment & Plan Note (Signed)
Reasonable control on Amlodipine and Losartan CBC and C met today Reinforced DASH diet and exercise for weight loss

## 2018-06-16 NOTE — Assessment & Plan Note (Signed)
Mainly dysthymia Controlled off meds We will monitor

## 2018-06-16 NOTE — Assessment & Plan Note (Signed)
He will continue to follow with [facility]

## 2018-06-16 NOTE — Assessment & Plan Note (Signed)
He is not wearing his CPAP He is not interested in following up with pulmonology We will monitor

## 2018-06-16 NOTE — Assessment & Plan Note (Signed)
Stable on Flovent Encourage smoking cessation, he declines at this time

## 2018-06-16 NOTE — Assessment & Plan Note (Addendum)
Advised him to try Zantac 150 mg Advised him to try to identify triggers and avoid them Discussed how weight loss could help improve reflux

## 2018-06-24 ENCOUNTER — Telehealth: Payer: Self-pay

## 2018-06-24 MED ORDER — AMLODIPINE BESYLATE 10 MG PO TABS: 10.0000 mg | ORAL_TABLET | Freq: Every day | ORAL | 1 refills | Status: AC

## 2018-06-24 MED ORDER — GLIPIZIDE 10 MG PO TABS: 10.0000 mg | ORAL_TABLET | Freq: Two times a day (BID) | ORAL | 0 refills | Status: DC

## 2018-06-24 MED ORDER — LOSARTAN POTASSIUM 100 MG PO TABS: 100.0000 mg | ORAL_TABLET | Freq: Every day | ORAL | 1 refills | Status: DC

## 2018-06-24 MED ORDER — SITAGLIPTIN PHOSPHATE 50 MG PO TABS: 50.0000 mg | ORAL_TABLET | Freq: Every day | ORAL | 0 refills | Status: DC

## 2018-06-24 MED ORDER — FENOFIBRATE 160 MG PO TABS: 160.0000 mg | ORAL_TABLET | Freq: Every day | ORAL | 1 refills | Status: DC

## 2018-06-24 NOTE — Telephone Encounter (Signed)
We could give him Colchicine to take prn. If he wants med, let me know and I will send in. The problem is, sometimes he gets confused about what he is taking and when he is supposed to take it. I wouldn't want him to take it daily especially with his low uric acid level.

## 2018-06-24 NOTE — Telephone Encounter (Signed)
Pt wants to know what he should do if he has gout Sx being that you stated his uric acid level was normal did not want him to be on Allopurinol any longer.... Does he need to have med for PRN basis?.... Please advise

## 2018-09-04 ENCOUNTER — Other Ambulatory Visit: Payer: Self-pay | Admitting: Internal Medicine

## 2018-10-10 ENCOUNTER — Other Ambulatory Visit: Payer: Self-pay | Admitting: Internal Medicine

## 2018-10-12 NOTE — Telephone Encounter (Signed)
Pt has OV tomorrow, I will wait until after OV and/or labs

## 2018-10-13 ENCOUNTER — Ambulatory Visit: Payer: Medicare Other | Admitting: Internal Medicine

## 2018-10-20 ENCOUNTER — Ambulatory Visit: Payer: Medicare Other | Admitting: Internal Medicine

## 2018-10-28 NOTE — Telephone Encounter (Signed)
Patient left a voicemail stating that he has been out of his Glipizide for two weeks. Patient stated that the pharmacy has been trying to get this refilled for over two week.

## 2018-10-29 MED ORDER — GLIPIZIDE 10 MG PO TABS: 10.0000 mg | ORAL_TABLET | Freq: Two times a day (BID) | ORAL | 0 refills | Status: DC

## 2018-10-29 NOTE — Addendum Note (Signed)
Addended by: Roena Malady on: 10/29/2018 02:09 PM   Modules accepted: Orders

## 2018-11-09 ENCOUNTER — Ambulatory Visit: Payer: Medicare Other | Admitting: Internal Medicine

## 2019-01-01 ENCOUNTER — Telehealth: Payer: Self-pay | Admitting: Internal Medicine

## 2019-01-01 NOTE — Telephone Encounter (Signed)
Pt called stating that he has not been into office due to taking care of his mom. He says she is bad off and he is the only one that takes care of her. He wanted to let Rene Kocher know that he will come in as soon as she is better. He did say he is out of his Januvia, but he will try to come in as soon as possible to follow up.

## 2019-01-05 MED ORDER — SITAGLIPTIN PHOSPHATE 50 MG PO TABS: 50.0000 mg | ORAL_TABLET | Freq: Every day | ORAL | 0 refills | Status: DC

## 2019-01-05 NOTE — Addendum Note (Signed)
Addended by: Roena Malady on: 01/05/2019 03:58 PM   Modules accepted: Orders

## 2019-01-22 ENCOUNTER — Other Ambulatory Visit: Payer: Self-pay | Admitting: Internal Medicine

## 2019-03-31 ENCOUNTER — Other Ambulatory Visit: Payer: Self-pay | Admitting: Internal Medicine

## 2019-04-22 ENCOUNTER — Other Ambulatory Visit: Payer: Self-pay | Admitting: Internal Medicine

## 2019-07-27 ENCOUNTER — Other Ambulatory Visit: Payer: Self-pay | Admitting: Internal Medicine

## 2019-12-29 DIAGNOSIS — I491 Atrial premature depolarization: Secondary | ICD-10-CM | POA: Diagnosis not present

## 2019-12-29 DIAGNOSIS — J449 Chronic obstructive pulmonary disease, unspecified: Secondary | ICD-10-CM | POA: Diagnosis not present

## 2019-12-29 DIAGNOSIS — I10 Essential (primary) hypertension: Secondary | ICD-10-CM | POA: Diagnosis not present

## 2019-12-29 DIAGNOSIS — E785 Hyperlipidemia, unspecified: Secondary | ICD-10-CM | POA: Diagnosis not present

## 2020-02-27 ENCOUNTER — Emergency Department (HOSPITAL_COMMUNITY): Payer: Medicare Other

## 2020-02-27 ENCOUNTER — Inpatient Hospital Stay (HOSPITAL_COMMUNITY)
Admission: EM | Admit: 2020-02-27 | Discharge: 2020-03-01 | DRG: 378 | Disposition: A | Discharge status: Home Health | Payer: Medicare Other | Attending: Internal Medicine | Admitting: Internal Medicine

## 2020-02-27 ENCOUNTER — Other Ambulatory Visit: Payer: Self-pay

## 2020-02-27 ENCOUNTER — Encounter (HOSPITAL_COMMUNITY): Payer: Self-pay | Admitting: *Deleted

## 2020-02-27 DIAGNOSIS — I517 Cardiomegaly: Secondary | ICD-10-CM | POA: Diagnosis not present

## 2020-02-27 DIAGNOSIS — Z20822 Contact with and (suspected) exposure to covid-19: Secondary | ICD-10-CM | POA: Diagnosis not present

## 2020-02-27 DIAGNOSIS — I8500 Esophageal varices without bleeding: Secondary | ICD-10-CM | POA: Diagnosis not present

## 2020-02-27 DIAGNOSIS — Z8249 Family history of ischemic heart disease and other diseases of the circulatory system: Secondary | ICD-10-CM

## 2020-02-27 DIAGNOSIS — F1010 Alcohol abuse, uncomplicated: Secondary | ICD-10-CM | POA: Diagnosis present

## 2020-02-27 DIAGNOSIS — D689 Coagulation defect, unspecified: Secondary | ICD-10-CM | POA: Diagnosis not present

## 2020-02-27 DIAGNOSIS — K2100 Gastro-esophageal reflux disease with esophagitis, without bleeding: Secondary | ICD-10-CM | POA: Diagnosis present

## 2020-02-27 DIAGNOSIS — I10 Essential (primary) hypertension: Secondary | ICD-10-CM | POA: Diagnosis present

## 2020-02-27 DIAGNOSIS — E871 Hypo-osmolality and hyponatremia: Secondary | ICD-10-CM | POA: Diagnosis present

## 2020-02-27 DIAGNOSIS — K920 Hematemesis: Principal | ICD-10-CM | POA: Diagnosis present

## 2020-02-27 DIAGNOSIS — G4733 Obstructive sleep apnea (adult) (pediatric): Secondary | ICD-10-CM | POA: Diagnosis not present

## 2020-02-27 DIAGNOSIS — E119 Type 2 diabetes mellitus without complications: Secondary | ICD-10-CM | POA: Diagnosis present

## 2020-02-27 DIAGNOSIS — K449 Diaphragmatic hernia without obstruction or gangrene: Secondary | ICD-10-CM | POA: Diagnosis not present

## 2020-02-27 DIAGNOSIS — R918 Other nonspecific abnormal finding of lung field: Secondary | ICD-10-CM | POA: Diagnosis not present

## 2020-02-27 DIAGNOSIS — K2950 Unspecified chronic gastritis without bleeding: Secondary | ICD-10-CM | POA: Diagnosis not present

## 2020-02-27 DIAGNOSIS — Z6831 Body mass index (BMI) 31.0-31.9, adult: Secondary | ICD-10-CM

## 2020-02-27 DIAGNOSIS — K2970 Gastritis, unspecified, without bleeding: Secondary | ICD-10-CM | POA: Diagnosis not present

## 2020-02-27 DIAGNOSIS — Z880 Allergy status to penicillin: Secondary | ICD-10-CM

## 2020-02-27 DIAGNOSIS — E441 Mild protein-calorie malnutrition: Secondary | ICD-10-CM | POA: Diagnosis not present

## 2020-02-27 DIAGNOSIS — Y906 Blood alcohol level of 120-199 mg/100 ml: Secondary | ICD-10-CM | POA: Diagnosis present

## 2020-02-27 DIAGNOSIS — K921 Melena: Secondary | ICD-10-CM | POA: Diagnosis not present

## 2020-02-27 DIAGNOSIS — F17210 Nicotine dependence, cigarettes, uncomplicated: Secondary | ICD-10-CM | POA: Diagnosis present

## 2020-02-27 DIAGNOSIS — K7030 Alcoholic cirrhosis of liver without ascites: Secondary | ICD-10-CM | POA: Diagnosis present

## 2020-02-27 DIAGNOSIS — D62 Acute posthemorrhagic anemia: Secondary | ICD-10-CM | POA: Diagnosis present

## 2020-02-27 DIAGNOSIS — R7989 Other specified abnormal findings of blood chemistry: Secondary | ICD-10-CM

## 2020-02-27 DIAGNOSIS — M1A9XX1 Chronic gout, unspecified, with tophus (tophi): Secondary | ICD-10-CM | POA: Diagnosis present

## 2020-02-27 DIAGNOSIS — Z79899 Other long term (current) drug therapy: Secondary | ICD-10-CM

## 2020-02-27 DIAGNOSIS — K922 Gastrointestinal hemorrhage, unspecified: Secondary | ICD-10-CM | POA: Diagnosis not present

## 2020-02-27 DIAGNOSIS — L409 Psoriasis, unspecified: Secondary | ICD-10-CM | POA: Diagnosis present

## 2020-02-27 DIAGNOSIS — E1165 Type 2 diabetes mellitus with hyperglycemia: Secondary | ICD-10-CM | POA: Diagnosis not present

## 2020-02-27 DIAGNOSIS — E876 Hypokalemia: Secondary | ICD-10-CM | POA: Diagnosis present

## 2020-02-27 DIAGNOSIS — I8510 Secondary esophageal varices without bleeding: Secondary | ICD-10-CM | POA: Diagnosis present

## 2020-02-27 DIAGNOSIS — D72829 Elevated white blood cell count, unspecified: Secondary | ICD-10-CM | POA: Diagnosis not present

## 2020-02-27 DIAGNOSIS — Z7984 Long term (current) use of oral hypoglycemic drugs: Secondary | ICD-10-CM

## 2020-02-27 DIAGNOSIS — J449 Chronic obstructive pulmonary disease, unspecified: Secondary | ICD-10-CM | POA: Diagnosis present

## 2020-02-27 DIAGNOSIS — K2990 Gastroduodenitis, unspecified, without bleeding: Secondary | ICD-10-CM | POA: Diagnosis not present

## 2020-02-27 DIAGNOSIS — E785 Hyperlipidemia, unspecified: Secondary | ICD-10-CM | POA: Diagnosis not present

## 2020-02-27 DIAGNOSIS — G4730 Sleep apnea, unspecified: Secondary | ICD-10-CM | POA: Diagnosis present

## 2020-02-27 DIAGNOSIS — F1020 Alcohol dependence, uncomplicated: Secondary | ICD-10-CM | POA: Diagnosis present

## 2020-02-27 DIAGNOSIS — Z888 Allergy status to other drugs, medicaments and biological substances status: Secondary | ICD-10-CM

## 2020-02-27 DIAGNOSIS — K8020 Calculus of gallbladder without cholecystitis without obstruction: Secondary | ICD-10-CM | POA: Diagnosis present

## 2020-02-27 DIAGNOSIS — M1990 Unspecified osteoarthritis, unspecified site: Secondary | ICD-10-CM | POA: Diagnosis present

## 2020-02-27 DIAGNOSIS — Z833 Family history of diabetes mellitus: Secondary | ICD-10-CM

## 2020-02-27 DIAGNOSIS — F329 Major depressive disorder, single episode, unspecified: Secondary | ICD-10-CM | POA: Diagnosis present

## 2020-02-27 LAB — CBC WITH DIFFERENTIAL/PLATELET
Abs Immature Granulocytes: 0.04 10*3/uL (ref 0.00–0.07)
Basophils Absolute: 0.1 10*3/uL (ref 0.0–0.1)
Basophils Relative: 1 %
Eosinophils Absolute: 0.7 10*3/uL — ABNORMAL HIGH (ref 0.0–0.5)
Eosinophils Relative: 5 %
HCT: 38.9 % — ABNORMAL LOW (ref 39.0–52.0)
Hemoglobin: 13.1 g/dL (ref 13.0–17.0)
Immature Granulocytes: 0 %
Lymphocytes Relative: 14 %
Lymphs Abs: 1.8 10*3/uL (ref 0.7–4.0)
MCH: 29.8 pg (ref 26.0–34.0)
MCHC: 33.7 g/dL (ref 30.0–36.0)
MCV: 88.4 fL (ref 80.0–100.0)
Monocytes Absolute: 1 10*3/uL (ref 0.1–1.0)
Monocytes Relative: 8 %
Neutro Abs: 8.8 10*3/uL — ABNORMAL HIGH (ref 1.7–7.7)
Neutrophils Relative %: 72 %
Platelets: 154 10*3/uL (ref 150–400)
RBC: 4.4 MIL/uL (ref 4.22–5.81)
RDW: 14.6 % (ref 11.5–15.5)
WBC: 12.4 10*3/uL — ABNORMAL HIGH (ref 4.0–10.5)
nRBC: 0 % (ref 0.0–0.2)

## 2020-02-27 LAB — CBC
HCT: 36.4 % — ABNORMAL LOW (ref 39.0–52.0)
Hemoglobin: 11.8 g/dL — ABNORMAL LOW (ref 13.0–17.0)
MCH: 29.7 pg (ref 26.0–34.0)
MCHC: 32.4 g/dL (ref 30.0–36.0)
MCV: 91.7 fL (ref 80.0–100.0)
Platelets: 193 10*3/uL (ref 150–400)
RBC: 3.97 MIL/uL — ABNORMAL LOW (ref 4.22–5.81)
RDW: 14.9 % (ref 11.5–15.5)
WBC: 10.2 10*3/uL (ref 4.0–10.5)
nRBC: 0 % (ref 0.0–0.2)

## 2020-02-27 LAB — COMPREHENSIVE METABOLIC PANEL
ALT: 31 U/L (ref 0–44)
ALT: 27 U/L (ref 0–44)
AST: 62 U/L — ABNORMAL HIGH (ref 15–41)
AST: 51 U/L — ABNORMAL HIGH (ref 15–41)
Albumin: 3.2 g/dL — ABNORMAL LOW (ref 3.5–5.0)
Albumin: 2.7 g/dL — ABNORMAL LOW (ref 3.5–5.0)
Alkaline Phosphatase: 203 U/L — ABNORMAL HIGH (ref 38–126)
Alkaline Phosphatase: 176 U/L — ABNORMAL HIGH (ref 38–126)
Anion gap: 12 (ref 5–15)
Anion gap: 12 (ref 5–15)
BUN: 7 mg/dL (ref 6–20)
BUN: 14 mg/dL (ref 6–20)
CO2: 22 mmol/L (ref 22–32)
CO2: 17 mmol/L — ABNORMAL LOW (ref 22–32)
Calcium: 8.3 mg/dL — ABNORMAL LOW (ref 8.9–10.3)
Calcium: 8.2 mg/dL — ABNORMAL LOW (ref 8.9–10.3)
Chloride: 97 mmol/L — ABNORMAL LOW (ref 98–111)
Chloride: 103 mmol/L (ref 98–111)
Creatinine, Ser: 0.65 mg/dL (ref 0.61–1.24)
Creatinine, Ser: 0.69 mg/dL (ref 0.61–1.24)
GFR calc Af Amer: >60 mL/min (ref >60)
GFR calc Af Amer: >60 mL/min (ref >60)
GFR calc non Af Amer: >60 mL/min (ref >60)
GFR calc non Af Amer: >60 mL/min (ref >60)
Glucose, Bld: 210 mg/dL — ABNORMAL HIGH (ref 70–99)
Glucose, Bld: 129 mg/dL — ABNORMAL HIGH (ref 70–99)
Potassium: 3.3 mmol/L — ABNORMAL LOW (ref 3.5–5.1)
Potassium: 4.1 mmol/L (ref 3.5–5.1)
Sodium: 131 mmol/L — ABNORMAL LOW (ref 135–145)
Sodium: 132 mmol/L — ABNORMAL LOW (ref 135–145)
Total Bilirubin: 1.3 mg/dL — ABNORMAL HIGH (ref 0.3–1.2)
Total Bilirubin: 1.5 mg/dL — ABNORMAL HIGH (ref 0.3–1.2)
Total Protein: 8.3 g/dL — ABNORMAL HIGH (ref 6.5–8.1)
Total Protein: 6.8 g/dL (ref 6.5–8.1)

## 2020-02-27 LAB — HEMOGLOBIN A1C
Hgb A1c MFr Bld: 6.5 % — ABNORMAL HIGH (ref 4.8–5.6)
Mean Plasma Glucose: 139.85 mg/dL

## 2020-02-27 LAB — CBG MONITORING, ED
Glucose-Capillary: 133 mg/dL — ABNORMAL HIGH (ref 70–99)
Glucose-Capillary: 141 mg/dL — ABNORMAL HIGH (ref 70–99)
Glucose-Capillary: 176 mg/dL — ABNORMAL HIGH (ref 70–99)

## 2020-02-27 LAB — PHOSPHORUS: Phosphorus: 3.9 mg/dL (ref 2.5–4.6)

## 2020-02-27 LAB — TYPE AND SCREEN
ABO/RH(D): O NEG
ABO/RH(D): O NEG
Antibody Screen: NEGATIVE
Antibody Screen: NEGATIVE

## 2020-02-27 LAB — GLUCOSE, CAPILLARY: Glucose-Capillary: 171 mg/dL — ABNORMAL HIGH (ref 70–99)

## 2020-02-27 LAB — HEMOGLOBIN AND HEMATOCRIT, BLOOD
HCT: 36.7 % — ABNORMAL LOW (ref 39.0–52.0)
HCT: 36.1 % — ABNORMAL LOW (ref 39.0–52.0)
Hemoglobin: 11.8 g/dL — ABNORMAL LOW (ref 13.0–17.0)
Hemoglobin: 12.1 g/dL — ABNORMAL LOW (ref 13.0–17.0)

## 2020-02-27 LAB — SARS CORONAVIRUS 2 BY RT PCR (HOSPITAL ORDER, PERFORMED IN ~~LOC~~ HOSPITAL LAB): SARS Coronavirus 2: NEGATIVE

## 2020-02-27 LAB — PROTIME-INR
INR: 1.3 — ABNORMAL HIGH (ref 0.8–1.2)
Prothrombin Time: 15.5 seconds — ABNORMAL HIGH (ref 11.4–15.2)

## 2020-02-27 LAB — LIPASE, BLOOD: Lipase: 53 U/L — ABNORMAL HIGH (ref 11–51)

## 2020-02-27 LAB — ETHANOL: Alcohol, Ethyl (B): 174 mg/dL — ABNORMAL HIGH (ref <10)

## 2020-02-27 LAB — MAGNESIUM: Magnesium: 1.9 mg/dL (ref 1.7–2.4)

## 2020-02-27 LAB — HIV ANTIBODY (ROUTINE TESTING W REFLEX): HIV Screen 4th Generation wRfx: NONREACTIVE

## 2020-02-27 MED ORDER — THIAMINE HCL 100 MG PO TABS
100.0000 mg | ORAL_TABLET | Freq: Every day | ORAL | Status: DC
  Administered 2020-02-27 – 2020-03-01 (×4): 100 mg via ORAL
  Filled 2020-02-27 (×3): qty 1

## 2020-02-27 MED ORDER — FOLIC ACID 1 MG PO TABS
1.0000 mg | ORAL_TABLET | Freq: Every day | ORAL | Status: DC
  Administered 2020-02-27 – 2020-03-01 (×4): 1 mg via ORAL
  Filled 2020-02-27 (×4): qty 1

## 2020-02-27 MED ORDER — LORAZEPAM 2 MG/ML IJ SOLN
0.0000 mg | Freq: Four times a day (QID) | INTRAMUSCULAR | Status: AC
  Administered 2020-02-28: 2 mg via INTRAVENOUS
  Filled 2020-02-27: qty 1

## 2020-02-27 MED ORDER — MORPHINE SULFATE (PF) 2 MG/ML IV SOLN: 2.0000 mg | INTRAVENOUS | Status: DC | PRN

## 2020-02-27 MED ORDER — ACETAMINOPHEN 325 MG PO TABS: 650.0000 mg | ORAL_TABLET | Freq: Four times a day (QID) | ORAL | Status: DC | PRN

## 2020-02-27 MED ORDER — SIMVASTATIN 20 MG PO TABS
60.0000 mg | ORAL_TABLET | Freq: Every day | ORAL | Status: DC
  Administered 2020-02-27: 60 mg via ORAL
  Filled 2020-02-27: qty 6

## 2020-02-27 MED ORDER — ATORVASTATIN CALCIUM 10 MG PO TABS
10.0000 mg | ORAL_TABLET | Freq: Every day | ORAL | Status: DC
  Administered 2020-02-28 – 2020-03-01 (×3): 10 mg via ORAL
  Filled 2020-02-27 (×3): qty 1

## 2020-02-27 MED ORDER — THIAMINE HCL 100 MG/ML IJ SOLN: 100.0000 mg | Freq: Every day | INTRAMUSCULAR | Status: DC

## 2020-02-27 MED ORDER — SODIUM CHLORIDE 0.9 % IV SOLN: INTRAVENOUS | Status: AC

## 2020-02-27 MED ORDER — ADULT MULTIVITAMIN W/MINERALS CH
1.0000 | ORAL_TABLET | Freq: Every day | ORAL | Status: DC
  Administered 2020-02-28 – 2020-03-01 (×3): 1 via ORAL
  Filled 2020-02-27 (×4): qty 1

## 2020-02-27 MED ORDER — SODIUM CHLORIDE 0.9 % IV SOLN
80.0000 mg | Freq: Once | INTRAVENOUS | Status: AC
  Administered 2020-02-27: 05:00:00 80 mg via INTRAVENOUS
  Filled 2020-02-27: qty 80

## 2020-02-27 MED ORDER — PANTOPRAZOLE SODIUM 40 MG IV SOLR: 40.0000 mg | Freq: Two times a day (BID) | INTRAVENOUS | Status: DC

## 2020-02-27 MED ORDER — ADULT MULTIVITAMIN W/MINERALS CH
1.0000 | ORAL_TABLET | Freq: Every day | ORAL | Status: DC
  Administered 2020-02-27: 1 via ORAL

## 2020-02-27 MED ORDER — THIAMINE HCL 100 MG PO TABS
100.0000 mg | ORAL_TABLET | Freq: Every day | ORAL | Status: DC
  Filled 2020-02-27: qty 1

## 2020-02-27 MED ORDER — ONDANSETRON HCL 4 MG/2ML IJ SOLN
4.0000 mg | Freq: Once | INTRAMUSCULAR | Status: AC
  Administered 2020-02-27: 4 mg via INTRAVENOUS
  Filled 2020-02-27: qty 2

## 2020-02-27 MED ORDER — AMLODIPINE BESYLATE 10 MG PO TABS
10.0000 mg | ORAL_TABLET | Freq: Every day | ORAL | Status: DC
  Administered 2020-02-27 – 2020-03-01 (×4): 10 mg via ORAL
  Filled 2020-02-27: qty 1
  Filled 2020-02-27: qty 2
  Filled 2020-02-27 (×2): qty 1

## 2020-02-27 MED ORDER — LOSARTAN POTASSIUM 50 MG PO TABS
100.0000 mg | ORAL_TABLET | Freq: Every day | ORAL | Status: DC
  Administered 2020-02-27 – 2020-03-01 (×4): 100 mg via ORAL
  Filled 2020-02-27 (×2): qty 2
  Filled 2020-02-27: qty 4
  Filled 2020-02-27: qty 2

## 2020-02-27 MED ORDER — ONDANSETRON HCL 4 MG PO TABS: 4.0000 mg | ORAL_TABLET | Freq: Four times a day (QID) | ORAL | Status: DC | PRN

## 2020-02-27 MED ORDER — INSULIN ASPART 100 UNIT/ML ~~LOC~~ SOLN
0.0000 [IU] | SUBCUTANEOUS | Status: DC
  Administered 2020-02-27: 3 [IU] via SUBCUTANEOUS
  Administered 2020-02-27: 2 [IU] via SUBCUTANEOUS
  Administered 2020-02-28: 3 [IU] via SUBCUTANEOUS
  Administered 2020-02-28 (×3): 2 [IU] via SUBCUTANEOUS
  Administered 2020-02-29: 5 [IU] via SUBCUTANEOUS
  Administered 2020-02-29: 2 [IU] via SUBCUTANEOUS
  Administered 2020-02-29 – 2020-03-01 (×2): 3 [IU] via SUBCUTANEOUS
  Administered 2020-03-01: 2 [IU] via SUBCUTANEOUS
  Filled 2020-02-27: qty 1

## 2020-02-27 MED ORDER — SODIUM CHLORIDE 0.9 % IV SOLN
50.0000 ug/h | INTRAVENOUS | Status: AC
  Administered 2020-02-27 – 2020-02-29 (×7): 50 ug/h via INTRAVENOUS
  Filled 2020-02-27 (×12): qty 1

## 2020-02-27 MED ORDER — LORAZEPAM 2 MG/ML IJ SOLN
1.0000 mg | INTRAMUSCULAR | Status: AC | PRN
  Administered 2020-02-28: 2 mg via INTRAVENOUS
  Filled 2020-02-27: qty 1

## 2020-02-27 MED ORDER — FENOFIBRATE 160 MG PO TABS
160.0000 mg | ORAL_TABLET | Freq: Every day | ORAL | Status: DC
  Administered 2020-02-27 – 2020-03-01 (×4): 160 mg via ORAL
  Filled 2020-02-27 (×5): qty 1

## 2020-02-27 MED ORDER — OCTREOTIDE ACETATE 500 MCG/ML IJ SOLN
INTRAMUSCULAR | Status: AC
  Filled 2020-02-27: qty 1

## 2020-02-27 MED ORDER — OCTREOTIDE LOAD VIA INFUSION
100.0000 ug | Freq: Once | INTRAVENOUS | Status: AC
  Administered 2020-02-27: 100 ug via INTRAVENOUS
  Filled 2020-02-27: qty 50

## 2020-02-27 MED ORDER — ACETAMINOPHEN 650 MG RE SUPP: 650.0000 mg | Freq: Four times a day (QID) | RECTAL | Status: DC | PRN

## 2020-02-27 MED ORDER — INSULIN ASPART 100 UNIT/ML ~~LOC~~ SOLN: 0.0000 [IU] | Freq: Three times a day (TID) | SUBCUTANEOUS | Status: DC

## 2020-02-27 MED ORDER — LORAZEPAM 2 MG/ML IJ SOLN
0.0000 mg | Freq: Two times a day (BID) | INTRAMUSCULAR | Status: DC
  Administered 2020-02-29: 2 mg via INTRAVENOUS
  Filled 2020-02-27: qty 1

## 2020-02-27 MED ORDER — LORAZEPAM 1 MG PO TABS: 1.0000 mg | ORAL_TABLET | ORAL | Status: AC | PRN

## 2020-02-27 MED ORDER — ONDANSETRON HCL 4 MG/2ML IJ SOLN: 4.0000 mg | Freq: Four times a day (QID) | INTRAMUSCULAR | Status: DC | PRN

## 2020-02-27 MED ORDER — SODIUM CHLORIDE 0.9 % IV SOLN
8.0000 mg/h | INTRAVENOUS | Status: AC
  Administered 2020-02-27 – 2020-02-29 (×7): 8 mg/h via INTRAVENOUS
  Filled 2020-02-27 (×11): qty 80

## 2020-02-27 MED ORDER — POTASSIUM CHLORIDE 10 MEQ/100ML IV SOLN
10.0000 meq | INTRAVENOUS | Status: AC
  Administered 2020-02-27 (×3): 10 meq via INTRAVENOUS
  Filled 2020-02-27 (×3): qty 100

## 2020-02-27 MED ORDER — NICOTINE 14 MG/24HR TD PT24
14.0000 mg | MEDICATED_PATCH | Freq: Every day | TRANSDERMAL | Status: DC
  Administered 2020-02-27 – 2020-03-01 (×4): 14 mg via TRANSDERMAL
  Filled 2020-02-27 (×4): qty 1
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
## 2020-02-27 NOTE — ED Notes (Signed)
Spoke with [facility] to update with room number and to set up transportation at this time.

## 2020-02-27 NOTE — Progress Notes (Addendum)
Patient admitted to the hospital earlier this morning by Dr. Carren Rang  Patient denies any abdominal pain at this time.  He is hemodynamically stable.  He reports having 3 episodes of vomiting since coming to the emergency room.  Reports them as small volume.  This is in comparison to a large volume emesis at home.  He also was having melena at home, but has not had any since coming to the hospital.  Denies any dizziness.  Abdomen is benign.  Lungs are clear bilaterally  Assessment/plan:   1. Upper GI bleeding.  Patient has had several episodes of hematemesis.  He has a history of alcohol abuse.  Currently on octreotide and Protonix infusions.  ED has discussed his care with gastroenterology, Dr. Marina Goodell at [facility] who has recommended admission account.  He is also on Rocephin since it is unclear whether he has any esophageal varices. 2. Hypokalemia.  Replace 3. History of alcohol abuse.  On CIWA protocol. 4. Diabetes.  A1c 6.5.  Holding oral hypoglycemic.  Continue on sliding scale insulin.  [employer]

## 2020-02-27 NOTE — ED Notes (Signed)
[facility] at bedside 

## 2020-02-27 NOTE — ED Notes (Signed)
Mother left with family

## 2020-02-27 NOTE — ED Triage Notes (Signed)
Pt c/o vomiting blood that started about a hour ago, denies any pain, states that he has history of "bleeding ulcers"

## 2020-02-27 NOTE — ED Notes (Signed)
Pt acompanied by elderly mother, next of kin contacted. They will be coming to take responsibility for elderly mother.
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
## 2020-02-28 ENCOUNTER — Encounter (HOSPITAL_COMMUNITY): Payer: Self-pay | Admitting: Family Medicine

## 2020-02-28 ENCOUNTER — Other Ambulatory Visit: Payer: Self-pay

## 2020-02-28 ENCOUNTER — Inpatient Hospital Stay (HOSPITAL_COMMUNITY): Payer: Medicare Other

## 2020-02-28 DIAGNOSIS — K920 Hematemesis: Secondary | ICD-10-CM | POA: Diagnosis not present

## 2020-02-28 DIAGNOSIS — E1165 Type 2 diabetes mellitus with hyperglycemia: Secondary | ICD-10-CM | POA: Diagnosis not present

## 2020-02-28 DIAGNOSIS — F1010 Alcohol abuse, uncomplicated: Secondary | ICD-10-CM

## 2020-02-28 DIAGNOSIS — D62 Acute posthemorrhagic anemia: Secondary | ICD-10-CM | POA: Diagnosis present

## 2020-02-28 DIAGNOSIS — K921 Melena: Secondary | ICD-10-CM

## 2020-02-28 DIAGNOSIS — R7989 Other specified abnormal findings of blood chemistry: Secondary | ICD-10-CM | POA: Diagnosis not present

## 2020-02-28 DIAGNOSIS — K922 Gastrointestinal hemorrhage, unspecified: Secondary | ICD-10-CM | POA: Diagnosis not present

## 2020-02-28 LAB — BASIC METABOLIC PANEL
Anion gap: 9 (ref 5–15)
BUN: 13 mg/dL (ref 6–20)
CO2: 22 mmol/L (ref 22–32)
Calcium: 8.4 mg/dL — ABNORMAL LOW (ref 8.9–10.3)
Chloride: 104 mmol/L (ref 98–111)
Creatinine, Ser: 0.85 mg/dL (ref 0.61–1.24)
GFR calc Af Amer: >60 mL/min (ref >60)
GFR calc non Af Amer: >60 mL/min (ref >60)
Glucose, Bld: 147 mg/dL — ABNORMAL HIGH (ref 70–99)
Potassium: 3.6 mmol/L (ref 3.5–5.1)
Sodium: 135 mmol/L (ref 135–145)

## 2020-02-28 LAB — CBC WITH DIFFERENTIAL/PLATELET
Abs Immature Granulocytes: 0.04 10*3/uL (ref 0.00–0.07)
Basophils Absolute: 0.1 10*3/uL (ref 0.0–0.1)
Basophils Relative: 1 %
Eosinophils Absolute: 0.5 10*3/uL (ref 0.0–0.5)
Eosinophils Relative: 5 %
HCT: 34.2 % — ABNORMAL LOW (ref 39.0–52.0)
Hemoglobin: 11.2 g/dL — ABNORMAL LOW (ref 13.0–17.0)
Immature Granulocytes: 0 %
Lymphocytes Relative: 17 %
Lymphs Abs: 1.6 10*3/uL (ref 0.7–4.0)
MCH: 29.3 pg (ref 26.0–34.0)
MCHC: 32.7 g/dL (ref 30.0–36.0)
MCV: 89.5 fL (ref 80.0–100.0)
Monocytes Absolute: 1.1 10*3/uL — ABNORMAL HIGH (ref 0.1–1.0)
Monocytes Relative: 12 %
Neutro Abs: 5.8 10*3/uL (ref 1.7–7.7)
Neutrophils Relative %: 65 %
Platelets: 143 10*3/uL — ABNORMAL LOW (ref 150–400)
RBC: 3.82 MIL/uL — ABNORMAL LOW (ref 4.22–5.81)
RDW: 15.1 % (ref 11.5–15.5)
WBC: 9.1 10*3/uL (ref 4.0–10.5)
nRBC: 0 % (ref 0.0–0.2)

## 2020-02-28 LAB — GLUCOSE, CAPILLARY
Glucose-Capillary: 106 mg/dL — ABNORMAL HIGH (ref 70–99)
Glucose-Capillary: 122 mg/dL — ABNORMAL HIGH (ref 70–99)
Glucose-Capillary: 124 mg/dL — ABNORMAL HIGH (ref 70–99)
Glucose-Capillary: 139 mg/dL — ABNORMAL HIGH (ref 70–99)
Glucose-Capillary: 149 mg/dL — ABNORMAL HIGH (ref 70–99)
Glucose-Capillary: 163 mg/dL — ABNORMAL HIGH (ref 70–99)
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
## 2020-02-28 NOTE — Progress Notes (Signed)
PROGRESS NOTE    [name]  KXF:[id number] DOB: [birth year] DOA: 02/27/2020 PCP: Lorre Munroe, NP   Brief Narrative:  [name]  is a 54 y.o. male, with history of peptic ulcer disease, nerve damage, hyperlipidemia, hypertension, GERD, COPD, alcohol abuse presents to the ED with a chief complaint of hematemesis.  Patient reports that he had just laid down to go to bed around 1:00 in the morning when he felt nauseous went to the bathroom and threw up dark red blood.  Patient reports he had no flushing no chest pain no palpitations no lightheadedness.  Patient reports that he continued to have episodes of bloody emesis for total of 4 episodes.  He has never had this happen before.  He reports he had melena at the time that he had the bleeding ulcer.  Bleeding ulcer was more than 10 years ago.  Patient does not have any known liver disease, or esophageal varices.  Patient reports no abdominal pain.  His last meal was dinner on the 24th.  His bowel movements have been regular every morning without changes.  He has no other bleeding.  Patient reports that he drinks 2 to 3 22 ounce beers 1-2 times daily.  Patient smokes 1/2 pack to a pack a day.  Patient denies illicit drug use  ED provider spoke with GI at [facility] who advised admission to [facility].  ED course temperature 98.4, blood pressure 142/90, heart rate 101, respiratory rate 18, O2 99% Octreotide and Protonix given Hemoglobin 13, leukocytosis of 12.4 Type and screen completed Alcohol level of 174 KUB equals nonobstructive bowel gas pattern, cholelithiasis, decreased lung volumes.  Assessment & Plan:   Active Problems:   Upper GI bleed   Hematemesis/upper GI bleed: Presented with 4 episodes of hematemesis.  Hemoglobin 13.1.  Remains on octreotide and Protonix drip.  GI on board.  Likely plans for EGD today.  Currently hemoglobin just over 11.  No more hematemesis or any other complaint now.  Alcoholic liver disease:  Stable.  Cholelithiasis: Stable with no complaints.  Hypokalemia: Resolved.  Mild protein calorie malnutrition: We will consult nutrition.  EtOH dependency: No signs of withdrawal.  CIWA less than 2 with last 24 hours and has not required any Ativan.  Continue CIWA protocol with as needed Ativan.  Type 2 diabetes mellitus: Takes Januvia and glipizide at home.  Oral medications on hold.  Blood sugar controlled on SSI.  DVT prophylaxis: SCDs Start: 02/27/20 6967   Code Status: Full Code  Family Communication: None present at bedside.  Plan of care discussed with patient in length and he verbalized understanding and agreed with it.  Status is: Inpatient  Remains inpatient appropriate because:Ongoing diagnostic testing needed not appropriate for outpatient work up   Dispo: The patient is from: Home              Anticipated d/c is to: Home              Anticipated d/c date is: 1 day              Patient currently is not medically stable to d/c.        Estimated body mass index is 29.3 kg/m as calculated from the following:   Height as of this encounter: 6' (1.829 m).   Weight as of this encounter: 98 kg.      Nutritional status:               Consultants:  GI  Procedures:   None  Antimicrobials:  Anti-infectives (From admission, onward)   None         Subjective: Seen and examined.  Feels better.  No hematemesis or any other complaint.  Objective: Vitals:   02/27/20 1946 02/28/20 0040 02/28/20 0406 02/28/20 0730  BP: (!) 139/91 (!) 129/87 (!) 133/89 128/80  Pulse: 86 88 84 88  Resp: 18 19 18 18   Temp: 98.6 F (37 C) 99.2 F (37.3 C) 98.3 F (36.8 C) 98 F (36.7 C)  TempSrc: Oral Oral  Oral  SpO2: 100% 94% 99% 96%  Weight: (!) 98 kg     Height: 6' (1.829 m)       Intake/Output Summary (Last 24 hours) at 02/28/2020 1119 Last data filed at 02/28/2020 1000 Gross per 24 hour  Intake 1094.18 ml  Output 2350 ml  Net -1255.82 ml   Filed  Weights   02/27/20 0343 02/27/20 1946  Weight: (!) 93 kg (!) 98 kg    Examination:  General exam: Appears calm and comfortable  Respiratory system: Clear to auscultation. Respiratory effort normal. Cardiovascular system: S1 & S2 heard, RRR. No JVD, murmurs, rubs, gallops or clicks. No pedal edema. Gastrointestinal system: Abdomen is nondistended, soft and nontender. No organomegaly or masses felt. Normal bowel sounds heard. Central nervous system: Alert and oriented. No focal neurological deficits. Extremities: Symmetric 5 x 5 power. Skin: No rashes, lesions or ulcers Psychiatry: Judgement and insight appear poor. Mood & affect appropriate.    Data Reviewed: I have personally reviewed following labs and imaging studies  CBC: Recent Labs  Lab 02/27/20 0350 02/27/20 0855 02/27/20 1447 02/28/20 1010  WBC 12.4* 10.2  --  9.1  NEUTROABS 8.8*  --   --  5.8  HGB 13.1 11.8*  11.8* 12.1* 11.2*  HCT 38.9* 36.4*  36.7* 36.1* 34.2*  MCV 88.4 91.7  --  89.5  PLT 154 193  --  143*   Basic Metabolic Panel: Recent Labs  Lab 02/27/20 0350 02/27/20 0855 02/28/20 1010  NA 131* 132* 135  K 3.3* 4.1 3.6  CL 97* 103 104  CO2 22 17* 22  GLUCOSE 210* 129* 147*  BUN 7 14 13   CREATININE 0.65 0.69 0.85  CALCIUM 8.3* 8.2* 8.4*  MG  --  1.9  --   PHOS  --  3.9  --    GFR: Estimated Creatinine Clearance: 117.8 mL/min (by C-G formula based on SCr of 0.85 mg/dL). Liver Function Tests: Recent Labs  Lab 02/27/20 0350 02/27/20 0855  AST 62* 51*  ALT 31 27  ALKPHOS 203* 176*  BILITOT 1.3* 1.5*  PROT 8.3* 6.8  ALBUMIN 3.2* 2.7*   Recent Labs  Lab 02/27/20 0350  LIPASE 53*   No results for input(s): AMMONIA in the last 168 hours. Coagulation Profile: Recent Labs  Lab 02/27/20 0350  INR 1.3*   Cardiac Enzymes: No results for input(s): CKTOTAL, CKMB, CKMBINDEX, TROPONINI in the last 168 hours. BNP (last 3 results) No results for input(s): PROBNP in the last 8760  hours. HbA1C: Recent Labs    02/27/20 0855  HGBA1C 6.5*   CBG: Recent Labs  Lab 02/27/20 2010 02/28/20 0039 02/28/20 0404 02/28/20 0722 02/28/20 1103  GLUCAP 171* 106* 124* 149* 139*   Lipid Profile: No results for input(s): CHOL, HDL, LDLCALC, TRIG, CHOLHDL, LDLDIRECT in the last 72 hours. Thyroid Function Tests: No results for input(s): TSH, T4TOTAL, FREET4, T3FREE, THYROIDAB in the last 72 hours. Anemia Panel: No results  for input(s): VITAMINB12, FOLATE, FERRITIN, TIBC, IRON, RETICCTPCT in the last 72 hours. Sepsis Labs: No results for input(s): PROCALCITON, LATICACIDVEN in the last 168 hours.  Recent Results (from the past 240 hour(s))  SARS Coronavirus 2 by RT PCR (hospital order, performed in [facility] hospital lab) Nasopharyngeal Nasopharyngeal Swab     Status: None   Collection Time: 02/27/20  3:49 AM   Specimen: Nasopharyngeal Swab  Result Value Ref Range Status   SARS Coronavirus 2 NEGATIVE NEGATIVE Final    Comment: (NOTE) SARS-CoV-2 target nucleic acids are NOT DETECTED.  The SARS-CoV-2 RNA is generally detectable in upper and lower respiratory specimens during the acute phase of infection. The lowest concentration of SARS-CoV-2 viral copies this assay can detect is 250 copies / mL. A negative result does not preclude SARS-CoV-2 infection and should not be used as the sole basis for treatment or other patient management decisions.  A negative result may occur with improper specimen collection / handling, submission of specimen other than nasopharyngeal swab, presence of viral mutation(s) within the areas targeted by this assay, and inadequate number of viral copies (<250 copies / mL). A negative result must be combined with clinical observations, patient history, and epidemiological information.  Fact Sheet for Patients:   BoilerBrush.com.cy  Fact Sheet for Healthcare Providers: https://pope.com/  This  test is not yet approved or  cleared by the [place] FDA and has been authorized for detection and/or diagnosis of SARS-CoV-2 by FDA under an Emergency Use Authorization (EUA).  This EUA will remain in effect (meaning this test can be used) for the duration of the COVID-19 declaration under Section 564(b)(1) of the Act, 21 U.S.C. section 360bbb-3(b)(1), unless the authorization is terminated or revoked sooner.  Performed at [facility], [address], [place], [place] [address]       Radiology Studies: DG Abdomen Acute W/Chest  Result Date: 02/27/2020 CLINICAL DATA:  Vomiting blood that began 1 hour ago. History of bleeding ulcers. EXAM: DG ABDOMEN ACUTE W/ 1V CHEST COMPARISON:  01/23/2017 FINDINGS: There is no evidence of dilated bowel loops or free intraperitoneal air. Calcified gallstones are identified within the right upper quadrant of the abdomen. Mild cardiac enlargement. Decreased lung volumes. No pleural effusion, edema or airspace consolidation. IMPRESSION: 1. Nonobstructive bowel gas pattern. 2. Cholelithiasis. 3. Decreased lung volumes. Electronically Signed   By: Signa Kell M.D.   On: 02/27/2020 04:36    Scheduled Meds: . amLODipine  10 mg Oral Daily  . atorvastatin  10 mg Oral Daily  . fenofibrate  160 mg Oral Daily  . folic acid  1 mg Oral Daily  . insulin aspart  0-15 Units Subcutaneous Q4H  . LORazepam  0-4 mg Intravenous Q6H   Followed by  . [START ON 02/29/2020] LORazepam  0-4 mg Intravenous Q12H  . losartan  100 mg Oral Daily  . multivitamin with minerals  1 tablet Oral Daily  . nicotine  14 mg Transdermal Daily  . [START ON 03/01/2020] pantoprazole  40 mg Intravenous Q12H  . thiamine  100 mg Oral Daily   Or  . thiamine  100 mg Intravenous Daily   Continuous Infusions: . octreotide  (SANDOSTATIN)    IV infusion 50 mcg/hr (02/27/20 2324)  . pantoprozole (PROTONIX) infusion 8 mg/hr (02/27/20 2348)     LOS: 1 day   Time spent: 34 minutes   Hughie Closs, MD [facility]  02/28/2020, 11:19 AM   To contact the attending provider between 7A-7P or the  covering provider during after hours 7P-7A, please log into the web site [email or web address].

## 2020-02-28 NOTE — Anesthesia Preprocedure Evaluation (Addendum)
Anesthesia Evaluation  Patient identified by MRN, date of birth, ID band Patient awake    Reviewed: Allergy & Precautions, NPO status , Patient's Chart, lab work & pertinent test results  Airway Mallampati: IV  TM Distance: >3 FB Neck ROM: Full  Mouth opening: Limited Mouth Opening Comment: VERY SMALL MOUTH Dental no notable dental hx. (+) Edentulous Upper, Edentulous Lower   Pulmonary neg pulmonary ROS, sleep apnea , COPD, Current Smoker,   Distant Pulmonary exam normal breath sounds clear to auscultation (-) decreased breath sounds      Cardiovascular hypertension, Pt. on medications negative cardio ROS Normal cardiovascular exam Rhythm:Regular Rate:Normal     Neuro/Psych  Headaches, PSYCHIATRIC DISORDERS Depression negative neurological ROS  negative psych ROS   GI/Hepatic negative GI ROS, Neg liver ROS, PUD, GERD  Medicated,(+)     substance abuse  alcohol use,   Endo/Other  negative endocrine ROSdiabetes, Type 2  Renal/GU negative Renal ROS  negative genitourinary   Musculoskeletal negative musculoskeletal ROS (+) Arthritis , Osteoarthritis,    Abdominal   Peds negative pediatric ROS (+)  Hematology negative hematology ROS (+) Blood dyscrasia, anemia ,   Anesthesia Other Findings - HLD  Reproductive/Obstetrics negative OB ROS                            Lab Results  Component Value Date   WBC 9.1 02/28/2020   HGB 11.2 (L) 02/28/2020   HCT 34.2 (L) 02/28/2020   MCV 89.5 02/28/2020   PLT 143 (L) 02/28/2020   Lab Results  Component Value Date   CREATININE 0.85 02/28/2020   BUN 13 02/28/2020   NA 135 02/28/2020   K 3.6 02/28/2020   CL 104 02/28/2020   CO2 22 02/28/2020   Lab Results  Component Value Date   INR 1.3 (H) 02/27/2020    EKG: normal sinus rhythm.  Anesthesia Physical  Anesthesia Plan  ASA: III  Anesthesia Plan: MAC   Post-op Pain Management:     Induction: Intravenous  PONV Risk Score and Plan:   Airway Management Planned: Nasal Cannula, Simple Face Mask and Mask  Additional Equipment:   Intra-op Plan:   Post-operative Plan: Extubation in OR  Informed Consent: I have reviewed the patients History and Physical, chart, labs and discussed the procedure including the risks, benefits and alternatives for the proposed anesthesia with the patient or authorized representative who has indicated his/her understanding and acceptance.     Dental advisory given  Plan Discussed with: CRNA and Anesthesiologist  Anesthesia Plan Comments:        Anesthesia Quick Evaluation

## 2020-02-28 NOTE — Plan of Care (Signed)
  Problem: Health Behavior/Discharge Planning: Goal: Ability to manage health-related needs will improve Outcome: Progressing   Problem: Nutrition: Goal: Adequate nutrition will be maintained Outcome: Progressing   Problem: Coping: Goal: Level of anxiety will decrease Outcome: Progressing   
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
## 2020-02-28 NOTE — Plan of Care (Signed)
  Problem: Health Behavior/Discharge Planning: Goal: Ability to manage health-related needs will improve Outcome: Completed/Met   Problem: Clinical Measurements: Goal: Ability to maintain clinical measurements within normal limits will improve Outcome: Completed/Met Goal: Will remain free from infection Outcome: Completed/Met Goal: Diagnostic test results will improve Outcome: Completed/Met Goal: Respiratory complications will improve Outcome: Completed/Met Goal: Cardiovascular complication will be avoided Outcome: Completed/Met

## 2020-02-29 ENCOUNTER — Other Ambulatory Visit: Payer: Self-pay

## 2020-02-29 ENCOUNTER — Encounter (HOSPITAL_COMMUNITY)
Admission: EM | Disposition: A | Discharge status: Home Health | Payer: Self-pay | Source: Home / Self Care | Attending: Family Medicine

## 2020-02-29 ENCOUNTER — Inpatient Hospital Stay (HOSPITAL_COMMUNITY): Payer: Medicare Other | Admitting: Anesthesiology

## 2020-02-29 ENCOUNTER — Other Ambulatory Visit: Payer: Self-pay | Admitting: Physician Assistant

## 2020-02-29 ENCOUNTER — Encounter (HOSPITAL_COMMUNITY): Payer: Self-pay | Admitting: Family Medicine

## 2020-02-29 DIAGNOSIS — K2970 Gastritis, unspecified, without bleeding: Secondary | ICD-10-CM

## 2020-02-29 DIAGNOSIS — I8510 Secondary esophageal varices without bleeding: Secondary | ICD-10-CM

## 2020-02-29 DIAGNOSIS — K2100 Gastro-esophageal reflux disease with esophagitis, without bleeding: Secondary | ICD-10-CM | POA: Diagnosis not present

## 2020-02-29 DIAGNOSIS — K2990 Gastroduodenitis, unspecified, without bleeding: Secondary | ICD-10-CM | POA: Diagnosis not present

## 2020-02-29 DIAGNOSIS — K920 Hematemesis: Secondary | ICD-10-CM | POA: Diagnosis not present

## 2020-02-29 DIAGNOSIS — K7030 Alcoholic cirrhosis of liver without ascites: Secondary | ICD-10-CM | POA: Diagnosis present

## 2020-02-29 HISTORY — PX: BIOPSY: SHX5522

## 2020-02-29 HISTORY — PX: ESOPHAGOGASTRODUODENOSCOPY (EGD) WITH PROPOFOL: SHX5813

## 2020-02-29 LAB — CBC WITH DIFFERENTIAL/PLATELET
Abs Immature Granulocytes: 0.03 10*3/uL (ref 0.00–0.07)
Basophils Absolute: 0.1 10*3/uL (ref 0.0–0.1)
Basophils Relative: 1 %
Eosinophils Absolute: 0.6 10*3/uL — ABNORMAL HIGH (ref 0.0–0.5)
Eosinophils Relative: 7 %
HCT: 33.6 % — ABNORMAL LOW (ref 39.0–52.0)
Hemoglobin: 11.2 g/dL — ABNORMAL LOW (ref 13.0–17.0)
Immature Granulocytes: 0 %
Lymphocytes Relative: 17 %
Lymphs Abs: 1.6 10*3/uL (ref 0.7–4.0)
MCH: 29.6 pg (ref 26.0–34.0)
MCHC: 33.3 g/dL (ref 30.0–36.0)
MCV: 88.7 fL (ref 80.0–100.0)
Monocytes Absolute: 1.1 10*3/uL — ABNORMAL HIGH (ref 0.1–1.0)
Monocytes Relative: 11 %
Neutro Abs: 5.9 10*3/uL (ref 1.7–7.7)
Neutrophils Relative %: 64 %
Platelets: 152 10*3/uL (ref 150–400)
RBC: 3.79 MIL/uL — ABNORMAL LOW (ref 4.22–5.81)
RDW: 14.9 % (ref 11.5–15.5)
WBC: 9.3 10*3/uL (ref 4.0–10.5)
nRBC: 0 % (ref 0.0–0.2)

## 2020-02-29 LAB — BASIC METABOLIC PANEL
Anion gap: 9 (ref 5–15)
BUN: 10 mg/dL (ref 6–20)
CO2: 21 mmol/L — ABNORMAL LOW (ref 22–32)
Calcium: 8.3 mg/dL — ABNORMAL LOW (ref 8.9–10.3)
Chloride: 104 mmol/L (ref 98–111)
Creatinine, Ser: 0.79 mg/dL (ref 0.61–1.24)
GFR calc Af Amer: >60 mL/min (ref >60)
GFR calc non Af Amer: >60 mL/min (ref >60)
Glucose, Bld: 129 mg/dL — ABNORMAL HIGH (ref 70–99)
Potassium: 3.4 mmol/L — ABNORMAL LOW (ref 3.5–5.1)
Sodium: 134 mmol/L — ABNORMAL LOW (ref 135–145)

## 2020-02-29 LAB — GLUCOSE, CAPILLARY
Glucose-Capillary: 109 mg/dL — ABNORMAL HIGH (ref 70–99)
Glucose-Capillary: 123 mg/dL — ABNORMAL HIGH (ref 70–99)
Glucose-Capillary: 157 mg/dL — ABNORMAL HIGH (ref 70–99)
Glucose-Capillary: 205 mg/dL — ABNORMAL HIGH (ref 70–99)
Glucose-Capillary: 90 mg/dL (ref 70–99)

## 2020-02-29 SURGERY — ESOPHAGOGASTRODUODENOSCOPY (EGD) WITH PROPOFOL
Anesthesia: Monitor Anesthesia Care

## 2020-02-29 MED ORDER — ENSURE MAX PROTEIN PO LIQD
11.0000 [oz_av] | Freq: Two times a day (BID) | ORAL | Status: DC
  Administered 2020-02-29 (×2): 11 [oz_av] via ORAL
  Filled 2020-02-29 (×4): qty 330

## 2020-02-29 MED ORDER — LACTATED RINGERS IV SOLN: INTRAVENOUS | Status: DC | PRN

## 2020-02-29 MED ORDER — PROPOFOL 500 MG/50ML IV EMUL
INTRAVENOUS | Status: DC | PRN
  Administered 2020-02-29: 150 ug/kg/min via INTRAVENOUS

## 2020-02-29 MED ORDER — SODIUM CHLORIDE 0.9 % IV SOLN: 1.0000 g | INTRAVENOUS | Status: DC

## 2020-02-29 MED ORDER — LIDOCAINE 2% (20 MG/ML) 5 ML SYRINGE
INTRAMUSCULAR | Status: DC | PRN
  Administered 2020-02-29: 80 mg via INTRAVENOUS

## 2020-02-29 MED ORDER — PANTOPRAZOLE SODIUM 40 MG PO TBEC
40.0000 mg | DELAYED_RELEASE_TABLET | Freq: Two times a day (BID) | ORAL | Status: DC
  Administered 2020-02-29 – 2020-03-01 (×2): 40 mg via ORAL
  Filled 2020-02-29 (×2): qty 1

## 2020-02-29 MED ORDER — POTASSIUM CHLORIDE CRYS ER 20 MEQ PO TBCR
40.0000 meq | EXTENDED_RELEASE_TABLET | Freq: Once | ORAL | Status: AC
  Administered 2020-02-29: 40 meq via ORAL
  Filled 2020-02-29: qty 2

## 2020-02-29 MED ORDER — SODIUM CHLORIDE 0.9 % IV SOLN
1.0000 g | INTRAVENOUS | Status: DC
  Administered 2020-02-29: 1 g via INTRAVENOUS
  Filled 2020-02-29 (×2): qty 10

## 2020-02-29 SURGICAL SUPPLY — 15 items

## 2020-02-29 NOTE — Plan of Care (Signed)
  Problem: Nutrition: Goal: Adequate nutrition will be maintained Outcome: Progressing   Problem: Coping: Goal: Level of anxiety will decrease Outcome: Progressing   

## 2020-02-29 NOTE — Progress Notes (Signed)
PROGRESS NOTE    [name]  WGY:[id number] DOB: 1965-09-19 DOA: 02/27/2020 PCP: Lorre Munroe, NP   Brief Narrative:  [name]  is a 54 y.o. male, with history of peptic ulcer disease, nerve damage, hyperlipidemia, hypertension, GERD, COPD, alcohol abuse presented to [facility] with complaint of hematemesis. Patient reports that he had just laid down to go to bed around 1:00 in the morning when he felt nauseous went to the bathroom and threw up dark red blood.  he continued to have episodes of bloody emesis for total of 4 episodes.  No prior history of such. He reports he had melena at the time that he had the bleeding ulcer.  Bleeding ulcer was more than 10 years ago.  Patient does not have any known liver disease, or esophageal varices.    Patient reports that he drinks 2 to 3 22 ounce beers 1-2 times daily.  Patient smokes 1/2 pack to a pack a day.  Patient denies illicit drug use.  He was transferred to [facility] for GI assistance.  Underwent EGD today.  Rest as below.  Assessment & Plan:   Active Problems:   HTN (hypertension)   Alcohol abuse   DM (diabetes mellitus), type 2 (HCC)   Hematemesis with nausea   LFT elevation   Melena   Acute posthemorrhagic anemia   Secondary esophageal varices without bleeding (HCC)   Gastritis and gastroduodenitis   Hematemesis/upper GI bleed: Presented with 4 episodes of hematemesis.  Hemoglobin 13.1 at presentation.  Underwent EGD which shows grade 1 esophageal varices, hiatal hernia, reflux esophagitis, gastritis, no active bleeding.  GI recommends continuing octreotide for total 72 hours which will be tomorrow night.  We will also continue Protonix drip until GI recommend switching to oral.  Dropped hemoglobin to 11.2.  No indication of transfusion.  Monitor daily.    Alcoholic liver disease/liver cirrhosis: Stable.  Continue Rocephin for total of 5 days per GI recommendation due to new diagnosis of liver cirrhosis.  Cholelithiasis:  Stable with no complaints.  Hypokalemia: 3.4.  Replace orally.  Mild protein calorie malnutrition: Nutrition on board.  EtOH dependency: No signs of withdrawal.  CIWA less than 2 with last 24 hours and has not required any Ativan.  Continue CIWA protocol with as needed Ativan.  Type 2 diabetes mellitus: Takes Januvia and glipizide at home.  Oral medications on hold.  Blood sugar controlled on SSI.  DVT prophylaxis: SCDs Start: 02/27/20 7793   Code Status: Full Code  Family Communication: None present at bedside.  Plan of care discussed with patient in length and he verbalized understanding and agreed with it.  Status is: Inpatient  Remains inpatient appropriate because:Ongoing diagnostic testing needed not appropriate for outpatient work up   Dispo: The patient is from: Home              Anticipated d/c is to: Home              Anticipated d/c date is: 1 day              Patient currently is not medically stable to d/c.        Estimated body mass index is 31.19 kg/m as calculated from the following:   Height as of this encounter: 6' (1.829 m).   Weight as of this encounter: 104.3 kg.      Nutritional status:               Consultants:  GI  Procedures:   EGD  Antimicrobials:  Anti-infectives (From admission, onward)   None         Subjective: Seen and examined after the EGD.  Still lethargic but easily arousable.  Denied any complaint.  Objective: Vitals:   02/29/20 0757 02/29/20 0805 02/29/20 0820 02/29/20 0853  BP: (!) 95/64 114/76 123/85 (!) 133/89  Pulse:  78 74 71  Resp: (!) 27 (!) 28 (!) 24 18  Temp: 97.9 F (36.6 C)   98 F (36.7 C)  TempSrc: Axillary   Axillary  SpO2: 97% 99% 99% 96%  Weight:      Height:        Intake/Output Summary (Last 24 hours) at 02/29/2020 1034 Last data filed at 02/29/2020 0948 Gross per 24 hour  Intake 1495.06 ml  Output 2025 ml  Net -529.94 ml   Filed Weights   02/27/20 0343 02/27/20 1946  02/29/20 0657  Weight: (!) 93 kg (!) 98 kg (!) 104.3 kg    Examination:  General exam: Appears slightly lethargic but comfortable Respiratory system: Clear to auscultation. Respiratory effort normal. Cardiovascular system: S1 & S2 heard, RRR. No JVD, murmurs, rubs, gallops or clicks. No pedal edema. Gastrointestinal system: Abdomen is nondistended, soft and nontender. No organomegaly or masses felt. Normal bowel sounds heard. Central nervous system: Alert and oriented. No focal neurological deficits. Extremities: Symmetric 5 x 5 power. Skin: No rashes, lesions or ulcers.    Data Reviewed: I have personally reviewed following labs and imaging studies  CBC: Recent Labs  Lab 02/27/20 0350 02/27/20 0855 02/27/20 1447 02/28/20 1010 02/29/20 0546  WBC 12.4* 10.2  --  9.1 9.3  NEUTROABS 8.8*  --   --  5.8 5.9  HGB 13.1 11.8*  11.8* 12.1* 11.2* 11.2*  HCT 38.9* 36.4*  36.7* 36.1* 34.2* 33.6*  MCV 88.4 91.7  --  89.5 88.7  PLT 154 193  --  143* 152   Basic Metabolic Panel: Recent Labs  Lab 02/27/20 0350 02/27/20 0855 02/28/20 1010 02/29/20 0546  NA 131* 132* 135 134*  K 3.3* 4.1 3.6 3.4*  CL 97* 103 104 104  CO2 22 17* 22 21*  GLUCOSE 210* 129* 147* 129*  BUN 7 14 13 10   CREATININE 0.65 0.69 0.85 0.79  CALCIUM 8.3* 8.2* 8.4* 8.3*  MG  --  1.9  --   --   PHOS  --  3.9  --   --    GFR: Estimated Creatinine Clearance: 128.8 mL/min (by C-G formula based on SCr of 0.79 mg/dL). Liver Function Tests: Recent Labs  Lab 02/27/20 0350 02/27/20 0855  AST 62* 51*  ALT 31 27  ALKPHOS 203* 176*  BILITOT 1.3* 1.5*  PROT 8.3* 6.8  ALBUMIN 3.2* 2.7*   Recent Labs  Lab 02/27/20 0350  LIPASE 53*   No results for input(s): AMMONIA in the last 168 hours. Coagulation Profile: Recent Labs  Lab 02/27/20 0350  INR 1.3*   Cardiac Enzymes: No results for input(s): CKTOTAL, CKMB, CKMBINDEX, TROPONINI in the last 168 hours. BNP (last 3 results) No results for input(s):  PROBNP in the last 8760 hours. HbA1C: Recent Labs    02/27/20 0855  HGBA1C 6.5*   CBG: Recent Labs  Lab 02/28/20 1103 02/28/20 1556 02/28/20 2035 02/29/20 0004 02/29/20 0457  GLUCAP 139* 122* 163* 90 109*   Lipid Profile: No results for input(s): CHOL, HDL, LDLCALC, TRIG, CHOLHDL, LDLDIRECT in the last 72 hours. Thyroid Function Tests: No results for input(s):  TSH, T4TOTAL, FREET4, T3FREE, THYROIDAB in the last 72 hours. Anemia Panel: No results for input(s): VITAMINB12, FOLATE, FERRITIN, TIBC, IRON, RETICCTPCT in the last 72 hours. Sepsis Labs: No results for input(s): PROCALCITON, LATICACIDVEN in the last 168 hours.  Recent Results (from the past 240 hour(s))  SARS Coronavirus 2 by RT PCR (hospital order, performed in [facility] hospital lab) Nasopharyngeal Nasopharyngeal Swab     Status: None   Collection Time: 02/27/20  3:49 AM   Specimen: Nasopharyngeal Swab  Result Value Ref Range Status   SARS Coronavirus 2 NEGATIVE NEGATIVE Final    Comment: (NOTE) SARS-CoV-2 target nucleic acids are NOT DETECTED.  The SARS-CoV-2 RNA is generally detectable in upper and lower respiratory specimens during the acute phase of infection. The lowest concentration of SARS-CoV-2 viral copies this assay can detect is 250 copies / mL. A negative result does not preclude SARS-CoV-2 infection and should not be used as the sole basis for treatment or other patient management decisions.  A negative result may occur with improper specimen collection / handling, submission of specimen other than nasopharyngeal swab, presence of viral mutation(s) within the areas targeted by this assay, and inadequate number of viral copies (<250 copies / mL). A negative result must be combined with clinical observations, patient history, and epidemiological information.  Fact Sheet for Patients:   BoilerBrush.com.cy  Fact Sheet for Healthcare  Providers: https://pope.com/  This test is not yet approved or  cleared by the [place] FDA and has been authorized for detection and/or diagnosis of SARS-CoV-2 by FDA under an Emergency Use Authorization (EUA).  This EUA will remain in effect (meaning this test can be used) for the duration of the COVID-19 declaration under Section 564(b)(1) of the Act, 21 U.S.C. section 360bbb-3(b)(1), unless the authorization is terminated or revoked sooner.  Performed at [facility], [address] [place], [place] [address]       Radiology Studies: US Abdomen Limited  Result Date: 02/28/2020 CLINICAL DATA:  Elevated LFT EXAM: ULTRASOUND ABDOMEN LIMITED RIGHT UPPER QUADRANT COMPARISON:  None. FINDINGS: Gallbladder: Multiple shadowing stones measuring up to 1.5 cm. Increased wall thickness up to 7.8 mm. Negative sonographic Murphy. Common bile duct: Diameter: 5.3 mm Liver: Liver is slightly echogenic. Nodular hepatic contour suspicious for cirrhosis. Portal vein is patent on color Doppler imaging with normal direction of blood flow towards the liver. Other: None. IMPRESSION: 1. Cholelithiasis with increased gallbladder wall thickness but negative sonographic Murphy. Findings are nonspecific and could be secondary to cholecystitis, liver disease, or edema forming states. 2. Suspected liver cirrhosis Electronically Signed   By: Jasmine Pang M.D.   On: 02/28/2020 19:44    Scheduled Meds: . amLODipine  10 mg Oral Daily  . atorvastatin  10 mg Oral Daily  . fenofibrate  160 mg Oral Daily  . folic acid  1 mg Oral Daily  . insulin aspart  0-15 Units Subcutaneous Q4H  . LORazepam  0-4 mg Intravenous Q12H  . losartan  100 mg Oral Daily  . multivitamin with minerals  1 tablet Oral Daily  . nicotine  14 mg Transdermal Daily  . [START ON 03/01/2020] pantoprazole  40 mg Intravenous Q12H  . thiamine  100 mg Oral Daily   Or  . thiamine  100 mg Intravenous Daily   Continuous  Infusions: . octreotide  (SANDOSTATIN)    IV infusion 50 mcg/hr (02/29/20 0948)  . pantoprozole (PROTONIX) infusion 8 mg/hr (02/29/20 0731)     LOS: 2 days  Time spent: 30 minutes   Hughie Closs, MD [facility]  02/29/2020, 10:34 AM   To contact the attending provider between 7A-7P or the covering provider during after hours 7P-7A, please log into the web site [email or web address].

## 2020-02-29 NOTE — Progress Notes (Addendum)
Daily Rounding Note  02/29/2020, 10:29 AM  LOS: 2 days   SUBJECTIVE:   Chief complaint: Hematemesis.  Cirrhosis.      No nausea, vomiting.  No abdominal pain.  No stools for more than 36 hours.  Tolerating clear liquids.  Feels okay.  OBJECTIVE:         Vital signs in last 24 hours:    Temp:  [97.9 F (36.6 C)-98.2 F (36.8 C)] 98 F (36.7 C) (07/27 0853) Pulse Rate:  [69-81] 71 (07/27 0853) Resp:  [18-28] 18 (07/27 0853) BP: (95-144)/(64-96) 133/89 (07/27 0853) SpO2:  [96 %-100 %] 96 % (07/27 0853) Weight:  [104.3 kg] 104.3 kg (07/27 0657) Last BM Date: 02/27/20 Filed Weights   02/27/20 0343 02/27/20 1946 02/29/20 0657  Weight: (!) 93 kg (!) 98 kg (!) 104.3 kg   General: Looks mildly chronically ill.  Comfortable. Heart: RRR. Chest: Clear bilaterally without labored breathing or cough Abdomen: Nontender, nondistended.  Active bowel sounds Extremities: No CCE. Neuro/Psych: Oriented x3.  No tremors.  No asterixis  Intake/Output from previous day: 07/26 0701 - 07/27 0700 In: 1215.1 [P.O.:460; I.V.:755.1] Out: 2125 [Urine:2125]  Intake/Output this shift: Total I/O In: 340 [P.O.:240; I.V.:100] Out: 300 [Urine:300]  Lab Results: Recent Labs    02/27/20 0855 02/27/20 0855 02/27/20 1447 02/28/20 1010 02/29/20 0546  WBC 10.2  --   --  9.1 9.3  HGB 11.8*  11.8*   < > 12.1* 11.2* 11.2*  HCT 36.4*  36.7*   < > 36.1* 34.2* 33.6*  PLT 193  --   --  143* 152   < > = values in this interval not displayed.   BMET Recent Labs    02/27/20 0855 02/28/20 1010 02/29/20 0546  NA 132* 135 134*  K 4.1 3.6 3.4*  CL 103 104 104  CO2 17* 22 21*  GLUCOSE 129* 147* 129*  BUN _0 CREATININE 0.69 0.85 0.79  CALCIUM 8.2* 8.4* 8.3*   LFT Recent Labs    02/27/20 0350 02/27/20 0855  PROT 8.3* 6.8  ALBUMIN 3.2* 2.7*  AST 62* 51*  ALT 31 27  ALKPHOS 203* 176*  BILITOT 1.3* 1.5*   PT/INR Recent Labs     02/27/20 0350  LABPROT 15.5*  INR 1.3*   Hepatitis Panel No results for input(s): HEPBSAG, HCVAB, HEPAIGM, HEPBIGM in the last 72 hours.  Studies/Results: US Abdomen Limited  Result Date: 02/28/2020 CLINICAL DATA:  Elevated LFT EXAM: ULTRASOUND ABDOMEN LIMITED RIGHT UPPER QUADRANT COMPARISON:  None. FINDINGS: Gallbladder: Multiple shadowing stones measuring up to 1.5 cm. Increased wall thickness up to 7.8 mm. Negative sonographic Murphy. Common bile duct: Diameter: 5.3 mm Liver: Liver is slightly echogenic. Nodular hepatic contour suspicious for cirrhosis. Portal vein is patent on color Doppler imaging with normal direction of blood flow towards the liver. Other: None. IMPRESSION: 1. Cholelithiasis with increased gallbladder wall thickness but negative sonographic Murphy. Findings are nonspecific and could be secondary to cholecystitis, liver disease, or edema forming states. 2. Suspected liver cirrhosis Electronically Signed   By: Donavan Foil M.D.   On: 02/28/2020 19:44   Scheduled Meds: . amLODipine  10 mg Oral Daily  . atorvastatin  10 mg Oral Daily  . fenofibrate  160 mg Oral Daily  . folic acid  1 mg Oral Daily  . insulin aspart  0-15 Units Subcutaneous Q4H  . LORazepam  0-4 mg Intravenous Q12H  . losartan  100  mg Oral Daily  . multivitamin with minerals  1 tablet Oral Daily  . nicotine  14 mg Transdermal Daily  . [START ON 03/01/2020] pantoprazole  40 mg Intravenous Q12H  . thiamine  100 mg Oral Daily   Or  . thiamine  100 mg Intravenous Daily   Continuous Infusions: . octreotide  (SANDOSTATIN)    IV infusion 50 mcg/hr (02/29/20 0948)  . pantoprozole (PROTONIX) infusion 8 mg/hr (02/29/20 0731)   PRN Meds:.acetaminophen **OR** acetaminophen, LORazepam **OR** LORazepam, morphine injection, ondansetron **OR** ondansetron (ZOFRAN) IV  ASSESMENT:   *  [place] emesis.   7/27 EGD: single grade 1 esoph varix, flattened with air insufflation, no bleeding stigmata.  HH.  Mild  esophagitis.  Diffuse gastritis, path:pndg.  Normal examined duodenum.   Day 2-3 Octreotide.  No signid anemia.  Hgb stable at 11.2.    *   Cirrhosis of liver.  MELD 10.  Child pugh A.  Mild elevation T bili, AST.  Moderate elevation alkaline phosphatase.   Continues daily beer drinking.  *   Hyponatremia  *   Hypokalemia.    *   Mild coagulopathy.  INR 1.3   PLAN   *   72 h octreotide gtt thru 7/28 at 0400.  Protonix 40 mg po bid.  5 d total abx, day 1 Rocephin.    *   At discharge should fup w Dr Alice Reichert at [facility] in [place].  *  If discharged before finishing 5 d of rocephin, complete the 5 days w cipro 500 mg po bid.      *   Discontinued the order for stool Hemoccult.  It is almost certainly going to be positive and will not change the course of care.  *    C-Met in the morning.  *   Advance to heart healthy/carb modified diet.    Azucena Freed  02/29/2020, 10:29 AM Phone [phone]

## 2020-02-29 NOTE — Anesthesia Postprocedure Evaluation (Signed)
Anesthesia Post Note  Patient: [name]  Procedure(s) Performed: ESOPHAGOGASTRODUODENOSCOPY (EGD) WITH PROPOFOL (N/A ) BIOPSY     Patient location during evaluation: PACU Anesthesia Type: MAC Level of consciousness: awake and alert Pain management: pain level controlled Vital Signs Assessment: post-procedure vital signs reviewed and stable Respiratory status: spontaneous breathing, nonlabored ventilation, respiratory function stable and patient connected to nasal cannula oxygen Cardiovascular status: stable and blood pressure returned to baseline Postop Assessment: no apparent nausea or vomiting Anesthetic complications: no   No complications documented.  Last Vitals:  Vitals:   02/29/20 0657 02/29/20 0757  BP: (!) 144/89 (!) 95/64  Pulse: 81   Resp: 23 (!) 27  Temp: 36.7 C 36.6 C  SpO2: 100% 97%    Last Pain:  Vitals:   02/29/20 0757  TempSrc: Axillary  PainSc: 0-No pain                 Marit Goodwill

## 2020-02-29 NOTE — Op Note (Signed)
[facility] Patient Name: [name] Procedure Date : 02/29/2020 MRN: [id number] Attending MD: Doristine Locks , MD Date of Birth: 02/01/1966 CSN: [id number] Age: 54 Admit Type: Inpatient Procedure:                Upper GI endoscopy Indications:              Acute post hemorrhagic anemia, Hematemesis                           54 yo male admitted with hematemesis in the setting                            of mildly elevated TBili (1.3), INR (1.3), reduced                            ALb (3.2), Na 134, normal BUN/creatinine. No                            recurrence but with downtrending Hgb from 13 on                            admission to 11.2 this AM.                           Ultrasound with nodular liver contours suspicious                            for cirrhosis. MELD 10, Child Pugh A (6 pts). Does                            have a history of PUD with UGIB ~2012. Providers:                Doristine Locks, MD, Rogue Jury, RN, Wanita Chamberlain, Technician Referring MD:              Medicines:                Monitored Anesthesia Care Complications:            No immediate complications. Estimated Blood Loss:     Estimated blood loss was minimal. Procedure:                Pre-Anesthesia Assessment:                           - Prior to the procedure, a History and Physical                            was performed, and patient medications and                            allergies were reviewed. The patient's tolerance of  previous anesthesia was also reviewed. The risks                            and benefits of the procedure and the sedation                            options and risks were discussed with the patient.                            All questions were answered, and informed consent                            was obtained. Prior Anticoagulants: The patient has                            taken no previous  anticoagulant or antiplatelet                            agents. ASA Grade Assessment: III - A patient with                            severe systemic disease. After reviewing the risks                            and benefits, the patient was deemed in                            satisfactory condition to undergo the procedure.                           After obtaining informed consent, the endoscope was                            passed under direct vision. Throughout the                            procedure, the patient's blood pressure, pulse, and                            oxygen saturations were monitored continuously. The                            GIF-H190 ([id number]) Olympus gastroscope was                            introduced through the mouth, and advanced to the                            second part of duodenum. The upper GI endoscopy was                            accomplished without difficulty. The patient  tolerated the procedure well. Scope In: Scope Out: Findings:      One column of Grade I varices were found in the lower third of the       esophagus. They were 4 mm in largest diameter and flattened completely       with insufflation. No high risk stigmata noted.      A 3 cm hiatal hernia was present.      LA Grade A (one or more mucosal breaks less than 5 mm, not extending       between tops of 2 mucosal folds) esophagitis with no bleeding was found       40 cm from the incisors. There were subtle mucosal changes in the distal       esophagus also suggestive of short segment Barrett's (measuring 1 cm).       This was not biopsied due to both the esophagitis and underlying       esophageal varices.      Diffuse moderate inflammation characterized by erythema and friability       was found in the entire examined stomach. Biopsies were taken with a       cold forceps for histology and Helicobacter pylori evaluation. Estimated       blood loss was  minimal.      The duodenal bulb, first portion of the duodenum and second portion of       the duodenum were normal. Impression:               - Single column of Grade I esophageal varices.                            Completely flattened with air insufflation and no                            high grade stigmata of bleeding.                           - 3 cm hiatal hernia.                           - LA Grade A reflux esophagitis with no bleeding.                           - Gastritis. Biopsied.                           - Normal duodenal bulb, first portion of the                            duodenum and second portion of the duodenum. Recommendation:           - Return patient to hospital ward for ongoing care.                           - Full liquid diet today, then advance as tolerated                            to low Na (<2 gm) diet.                           -  Continue present medications.                           - Await pathology results.                           - Resume Octreotide for 72 hours total and Abx for                            5 days today for upper GI bleed in the setting of                            newly diagnosed cirrhosis.                           - Use Protonix (pantoprazole) 40 mg PO BID for 8                            weeks.                           - Repeat upper endoscopy in 2 years for                            surveillance. Procedure Code(s):        --- Professional ---                           [phone], Esophagogastroduodenoscopy, flexible,                            transoral; with biopsy, single or multiple Diagnosis Code(s):        --- Professional ---                           I85.00, Esophageal varices without bleeding                           K44.9, Diaphragmatic hernia without obstruction or                            gangrene                           K21.00, Gastro-esophageal reflux disease with                            esophagitis,  without bleeding                           K29.70, Gastritis, unspecified, without bleeding                           D62, Acute posthemorrhagic anemia                           K92.0, Hematemesis CPT copyright 2019 American Medical Association. All rights reserved.  The codes documented in this report are preliminary and upon coder review may  be revised to meet current compliance requirements. Doristine Locks, MD 02/29/2020 8:16:22 AM Number of Addenda: 0

## 2020-02-29 NOTE — Progress Notes (Signed)
Initial Nutrition Assessment  DOCUMENTATION CODES:   Obesity unspecified  INTERVENTION:   Ensure Max po BID, each supplement provides 150 kcal and 30 grams of protein  Recommend liberalizing diet to regular as current diet order only provides 1647 kcals (meets 71% minimum estimated needs) and 86 grams of protein (meets 74% minimum estimated needs).   NUTRITION DIAGNOSIS:   Inadequate oral intake related to other (see comment) (diet order) as evidenced by other (comment) (Heart Healthy/Carbohydrate Modified diet providing only 1647 kcals and 86 grams of protein).    GOAL:   Patient will meet greater than or equal to 90% of their needs    MONITOR:   PO intake, Labs, I & O's, Supplement acceptance, Weight trends  REASON FOR ASSESSMENT:   Consult Assessment of nutrition requirement/status  ASSESSMENT:   Pt admitted with upper GI bleed. PMH includes peptic ulcer disease, nerve damaga, HLD, HTN, GERD, COPD, etOH abuse.   7/27 s/p EGD  Per H&P, pt drinks 2-3 22oz beers 1-2 times daily.   Pt lethargic and somewhat confused during RD exam. Pt states he typically eats 3 meals per day -- B: eggs, bacon, pancakes L: pot roast, potatoes, gravy D: [place]is[place] steak, gravy, potatoes. Pt provided no answer when asked about wt history PTA, but per wt readings, pt with wt gain over last 1.5 years.  PO Intake: 50-100% x3 recorded meals (75% average meal intake)  Of note, when a patient presents with low albumin, it is likely skewed due to the acute inflammatory response.  Unless it is suspected that patient had poor PO intake or malnutrition prior to admission, then RD should not be consulted solely for low albumin. Note that low albumin is no longer used to diagnose malnutrition; [place] uses the new malnutrition guidelines published by the American Society for Parenteral and Enteral Nutrition (A.S.P.E.N.) and the Academy of Nutrition and Dietetics (AND).     Labs: Na 134 (L), K+ 3.4  (L) CBGs 90-109-205 Medications: folvite, novolog, MVI, thiamine, protonix  NUTRITION - FOCUSED PHYSICAL EXAM:  Deferred; pt spilled [place] dressing on himself and requested he be cleaned off and changed prior to having an exam performed. Will attempt at follow-up.  Diet Order:   Diet Order            Diet heart healthy/carb modified Room service appropriate? Yes; Fluid consistency: Thin  Diet effective now                 EDUCATION NEEDS:   No education needs have been identified at this time  Skin:  Skin Assessment: Reviewed RN Assessment  Last BM:  02/27/20  Height:   Ht Readings from Last 1 Encounters:  02/29/20 6' (1.829 m)    Weight:   Wt Readings from Last 10 Encounters:  02/29/20 (!) 104.3 kg  06/11/18 97.1 kg  01/27/18 92.1 kg  08/26/17 98.4 kg  05/26/17 97.5 kg  03/31/17 95.7 kg  02/27/17 93.4 kg  02/13/17 92.5 kg  01/23/17 92 kg  08/27/16 97.6 kg    BMI:  Body mass index is 31.19 kg/m.  Estimated Nutritional Needs:   Kcal:  2300-2500  Protein:  115-130 grams  Fluid:  >/= 2.3L/d    Eugene Gavia, MS, RD, LDN RD pager number and weekend/on-call pager number located in Amion.

## 2020-02-29 NOTE — Transfer of Care (Signed)
Immediate Anesthesia Transfer of Care Note  Patient: [name]  Procedure(s) Performed: ESOPHAGOGASTRODUODENOSCOPY (EGD) WITH PROPOFOL (N/A ) BIOPSY  Patient Location: PACU  Anesthesia Type:MAC  Level of Consciousness: drowsy  Airway & Oxygen Therapy: Patient Spontanous Breathing and Patient connected to nasal cannula oxygen  Post-op Assessment: Report given to RN and Post -op Vital signs reviewed and stable  Post vital signs: Reviewed and stable  Last Vitals:  Vitals Value Taken Time  BP    Temp    Pulse    Resp    SpO2      Last Pain:  Vitals:   02/29/20 0657  TempSrc: Oral  PainSc: 0-No pain         Complications: No complications documented.

## 2020-02-29 NOTE — Interval H&P Note (Signed)
History and Physical Interval Note:  02/29/2020 7:19 AM  [name]  has presented today for surgery, with the diagnosis of Hematemesis.  Heavy drinker but no known liver disease..  The various methods of treatment have been discussed with the patient and family. After consideration of risks, benefits and other options for treatment, the patient has consented to  Procedure(s): ESOPHAGOGASTRODUODENOSCOPY (EGD) WITH POSSIBLE VARICEAL BANDING WITH PROPOFOL (N/A) as a surgical intervention.  The patient's history has been reviewed, patient examined, no change in status, stable for surgery.  I have reviewed the patient's chart and labs.  Questions were answered to the patient's satisfaction.     Verlin Dike Michaela Shankel

## 2020-03-01 DIAGNOSIS — K922 Gastrointestinal hemorrhage, unspecified: Secondary | ICD-10-CM

## 2020-03-01 LAB — CBC WITH DIFFERENTIAL/PLATELET
Abs Immature Granulocytes: 0.03 10*3/uL (ref 0.00–0.07)
Basophils Absolute: 0.1 10*3/uL (ref 0.0–0.1)
Basophils Relative: 1 %
Eosinophils Absolute: 0.6 10*3/uL — ABNORMAL HIGH (ref 0.0–0.5)
Eosinophils Relative: 7 %
HCT: 33.3 % — ABNORMAL LOW (ref 39.0–52.0)
Hemoglobin: 11.3 g/dL — ABNORMAL LOW (ref 13.0–17.0)
Immature Granulocytes: 0 %
Lymphocytes Relative: 23 %
Lymphs Abs: 2 10*3/uL (ref 0.7–4.0)
MCH: 30.3 pg (ref 26.0–34.0)
MCHC: 33.9 g/dL (ref 30.0–36.0)
MCV: 89.3 fL (ref 80.0–100.0)
Monocytes Absolute: 0.9 10*3/uL (ref 0.1–1.0)
Monocytes Relative: 10 %
Neutro Abs: 5.1 10*3/uL (ref 1.7–7.7)
Neutrophils Relative %: 59 %
Platelets: 153 10*3/uL (ref 150–400)
RBC: 3.73 MIL/uL — ABNORMAL LOW (ref 4.22–5.81)
RDW: 14.8 % (ref 11.5–15.5)
WBC: 8.7 10*3/uL (ref 4.0–10.5)
nRBC: 0 % (ref 0.0–0.2)

## 2020-03-01 LAB — COMPREHENSIVE METABOLIC PANEL
ALT: 27 U/L (ref 0–44)
AST: 60 U/L — ABNORMAL HIGH (ref 15–41)
Albumin: 2.6 g/dL — ABNORMAL LOW (ref 3.5–5.0)
Alkaline Phosphatase: 157 U/L — ABNORMAL HIGH (ref 38–126)
Anion gap: 8 (ref 5–15)
BUN: 9 mg/dL (ref 6–20)
CO2: 23 mmol/L (ref 22–32)
Calcium: 8.5 mg/dL — ABNORMAL LOW (ref 8.9–10.3)
Chloride: 103 mmol/L (ref 98–111)
Creatinine, Ser: 0.71 mg/dL (ref 0.61–1.24)
GFR calc Af Amer: >60 mL/min (ref >60)
GFR calc non Af Amer: >60 mL/min (ref >60)
Glucose, Bld: 136 mg/dL — ABNORMAL HIGH (ref 70–99)
Potassium: 3.4 mmol/L — ABNORMAL LOW (ref 3.5–5.1)
Sodium: 134 mmol/L — ABNORMAL LOW (ref 135–145)
Total Bilirubin: 1.6 mg/dL — ABNORMAL HIGH (ref 0.3–1.2)
Total Protein: 7.1 g/dL (ref 6.5–8.1)

## 2020-03-01 LAB — GLUCOSE, CAPILLARY
Glucose-Capillary: 103 mg/dL — ABNORMAL HIGH (ref 70–99)
Glucose-Capillary: 117 mg/dL — ABNORMAL HIGH (ref 70–99)
Glucose-Capillary: 131 mg/dL — ABNORMAL HIGH (ref 70–99)
Glucose-Capillary: 159 mg/dL — ABNORMAL HIGH (ref 70–99)

## 2020-03-01 MED ORDER — NICOTINE 14 MG/24HR TD PT24: 14.0000 mg | MEDICATED_PATCH | Freq: Every day | TRANSDERMAL | 0 refills | Status: AC

## 2020-03-01 MED ORDER — ADULT MULTIVITAMIN W/MINERALS CH: 1.0000 | ORAL_TABLET | Freq: Every day | ORAL | 0 refills | Status: AC

## 2020-03-01 MED ORDER — PANTOPRAZOLE SODIUM 40 MG PO TBEC: 40.0000 mg | DELAYED_RELEASE_TABLET | Freq: Two times a day (BID) | ORAL | 1 refills | Status: DC

## 2020-03-01 MED ORDER — THIAMINE HCL 100 MG PO TABS: 100.0000 mg | ORAL_TABLET | Freq: Every day | ORAL | 0 refills | Status: AC

## 2020-03-01 MED ORDER — POTASSIUM CHLORIDE CRYS ER 20 MEQ PO TBCR
20.0000 meq | EXTENDED_RELEASE_TABLET | Freq: Once | ORAL | Status: AC
  Administered 2020-03-01: 20 meq via ORAL
  Filled 2020-03-01: qty 1

## 2020-03-01 MED ORDER — FOLIC ACID 1 MG PO TABS: 1.0000 mg | ORAL_TABLET | Freq: Every day | ORAL | 0 refills | Status: AC

## 2020-03-01 NOTE — TOC Transition Note (Signed)
Transition of Care [facility]) - CM/SW Discharge Note   Patient Details  Name: [name] MRN: [id number] Date of Birth: Dec 23, 1965  [facility] CM/SW Contact:  Lawerance Sabal, RN Phone Number: 03/01/2020, 2:17 PM   Clinical Narrative:    PAtient would like HH services through [facility] as his mom is currently using them at the house as well. Referral accepted. No DME needs. No other CM needs identified.     Final next level of care: Home w Home Health Services Barriers to Discharge: No Barriers Identified   Patient Goals and CMS Choice Patient states their goals for this hospitalization and ongoing recovery are:: to go home CMS Medicare.gov Compare Post Acute Care list provided to:: Patient Choice offered to / list presented to : Patient  Discharge Placement                       Discharge Plan and Services                          HH Arranged: PT [facility] Agency: [facility] Date [facility] Agency Contacted: 03/01/20 Time HH Agency Contacted: [phone] Representative spoke with at [facility] Agency: [place]  Social Determinants of Health (SDOH) Interventions     Readmission Risk Interventions No flowsheet data found.

## 2020-03-01 NOTE — Progress Notes (Signed)
Physician Discharge Summary  [name] EQA:[id number] DOB: 1965-10-04 DOA: 02/27/2020  PCP: Lorre Munroe, NP  Admit date: 02/27/2020 Discharge date: 03/01/2020  Admitted From: Home  Disposition: Home   Recommendations for Outpatient Follow-up:  1. Follow up with PCP in 1-2 weeks 2. Please obtain BMP/CBC in one week 3. Needs to follow up with GI for further care of cirrhosis.   Home Health: yes  Discharge Condition: stable.  CODE STATUS; Full code Diet recommendation: Heart Healthy  Brief/Interim Summary: [name] a54 y.o.male,with history of peptic ulcer disease, nerve damage, hyperlipidemia, hypertension, GERD, COPD, alcohol abuse presented to [facility] ED with complaint of hematemesis. Patient reports that he had just laid down to go to bed around 1:00 in the morning when he felt nauseous went to the bathroom and threw up dark red blood.  he continued to have episodes of bloody emesis for total of 4 episodes.  No prior history of such.He reports he had melena at the time that he had the bleeding ulcer. Bleeding ulcer was more than 10 years ago. Patient does not have any known liver disease, or esophageal varices.   Patient reports that he drinks 2 to 3 22 ounce beers 1-2 times daily.Patient smokes 1/2 pack to a pack a day. Patient denies illicit drug use.  He was transferred to [facility] for GI assistance.  Underwent EGD today.  Rest as below.  Hematemesis/upper GI bleed: Presented with 4 episodes of hematemesis.  Hemoglobin 13.1 at presentation.  Underwent EGD which shows grade 1 esophageal varices, hiatal hernia, reflux esophagitis, gastritis, no active bleeding.  GI recommends continuing octreotide for total 72 hours which will be tomorrow night.  We will also continue Protonix drip until GI recommend switching to oral.  Dropped hemoglobin to 11.2.  No indication of transfusion.  Monitor daily.    Alcoholic liver disease/liver cirrhosis: Stable.  Continue Rocephin  for total of 5 days per GI recommendation due to new diagnosis of liver cirrhosis. Needs follow with GI.  Discharge on ciprofloxacin for 3 days.  Prescription called in to pharmacy. Patient will be informed.   Cholelithiasis: Stable with no complaints.  Hypokalemia: 3.4.  Replace orally.  Mild protein calorie malnutrition: Nutrition on board.  EtOH dependency: No signs of withdrawal.  CIWA less than 2 with last 24 hours and has not required any Ativan.  Continue CIWA protocol with as needed Ativan. No evidence of withdrawal.   Type 2 diabetes mellitus: Takes Januvia and glipizide at home.  Oral medications on hold.  Blood sugar controlled on SSI.  Discharge Diagnoses:  Active Problems:   HTN (hypertension)   Alcohol abuse   DM (diabetes mellitus), type 2 (HCC)   Hematemesis with nausea   LFT elevation   Melena   Acute posthemorrhagic anemia   Secondary esophageal varices without bleeding (HCC)   Gastritis and gastroduodenitis   Alcoholic cirrhosis of liver without ascites [facility])    Discharge Instructions  Discharge Instructions    Diet - low sodium heart healthy   Complete by: As directed    Increase activity slowly   Complete by: As directed      Allergies as of 03/01/2020      Reactions   Glipizide Other (See Comments)   Sore throat   Penicillins Itching, Rash   Has patient had a PCN reaction causing immediate rash, facial/tongue/throat swelling, SOB or lightheadedness with hypotension: No Has patient had a PCN reaction causing severe rash involving mucus membranes or skin necrosis: No  Has patient had a PCN reaction that required hospitalization No Has patient had a PCN reaction occurring within the last 10 years: No If all of the above answers are "NO", then may proceed with Cephalosporin use.      Medication List    STOP taking these medications   aspirin 500 MG tablet   atorvastatin 10 MG tablet Commonly known as: LIPITOR   fenofibrate 160 MG tablet    fluticasone 50 MCG/BLIST diskus inhaler Commonly known as: FLOVENT DISKUS   glipiZIDE 10 MG tablet Commonly known as: GLUCOTROL   Januvia 50 MG tablet Generic drug: sitaGLIPtin   simvastatin 40 MG tablet Commonly known as: ZOCOR     TAKE these medications   albuterol 108 (90 Base) MCG/ACT inhaler Commonly known as: VENTOLIN HFA Inhale 2 puffs into the lungs every 6 (six) hours as needed.   amLODipine 10 MG tablet Commonly known as: NORVASC Take 1 tablet (10 mg total) by mouth daily.   Fluticasone-Salmeterol 100-50 MCG/DOSE Aepb Commonly known as: ADVAIR Inhale 1 puff into the lungs every 12 (twelve) hours.   folic acid 1 MG tablet Commonly known as: FOLVITE Take 1 tablet (1 mg total) by mouth daily. Start taking on: March 02, 2020   glimepiride 2 MG tablet Commonly known as: AMARYL Take 2 mg by mouth every morning.   hydrocortisone cream 1 % Apply 1 application topically daily as needed (for itching/ezcema/rash).   losartan 25 MG tablet Commonly known as: COZAAR Take 25 mg by mouth daily.   multivitamin with minerals Tabs tablet Take 1 tablet by mouth daily. Start taking on: March 02, 2020 What changed: additional instructions   nicotine 14 mg/24hr patch Commonly known as: NICODERM CQ - dosed in mg/24 hours Place 1 patch (14 mg total) onto the skin daily. Start taking on: March 02, 2020   pantoprazole 40 MG tablet Commonly known as: PROTONIX Take 1 tablet (40 mg total) by mouth 2 (two) times daily.   thiamine 100 MG tablet Take 1 tablet (100 mg total) by mouth daily. Start taking on: March 02, 2020       Follow-up Information    Lorre Munroe, NP Follow up in 1 week(s).   Specialties: Internal Medicine, Emergency Medicine Contact information: [address] [place] [address] [phone]        Stanton Kidney, MD Follow up in 1 week(s).   Specialty: Gastroenterology Contact information: [address] [place]  [place] [address] [phone]              Allergies  Allergen Reactions  . Glipizide Other (See Comments)    Sore throat  . Penicillins Itching and Rash    Has patient had a PCN reaction causing immediate rash, facial/tongue/throat swelling, SOB or lightheadedness with hypotension: No Has patient had a PCN reaction causing severe rash involving mucus membranes or skin necrosis: No Has patient had a PCN reaction that required hospitalization No Has patient had a PCN reaction occurring within the last 10 years: No If all of the above answers are "NO", then may proceed with Cephalosporin use.     Consultations:  GI   Procedures/Studies: US Abdomen Limited  Result Date: 02/28/2020 CLINICAL DATA:  Elevated LFT EXAM: ULTRASOUND ABDOMEN LIMITED RIGHT UPPER QUADRANT COMPARISON:  None. FINDINGS: Gallbladder: Multiple shadowing stones measuring up to 1.5 cm. Increased wall thickness up to 7.8 mm. Negative sonographic Murphy. Common bile duct: Diameter: 5.3 mm Liver: Liver is slightly echogenic.  Nodular hepatic contour suspicious for cirrhosis. Portal vein is patent on color Doppler imaging with normal direction of blood flow towards the liver. Other: None. IMPRESSION: 1. Cholelithiasis with increased gallbladder wall thickness but negative sonographic Murphy. Findings are nonspecific and could be secondary to cholecystitis, liver disease, or edema forming states. 2. Suspected liver cirrhosis Electronically Signed   By: Jasmine Pang M.D.   On: 02/28/2020 19:44   DG Abdomen Acute W/Chest  Result Date: 02/27/2020 CLINICAL DATA:  Vomiting blood that began 1 hour ago. History of bleeding ulcers. EXAM: DG ABDOMEN ACUTE W/ 1V CHEST COMPARISON:  01/23/2017 FINDINGS: There is no evidence of dilated bowel loops or free intraperitoneal air. Calcified gallstones are identified within the right upper quadrant of the abdomen. Mild cardiac enlargement. Decreased lung volumes. No pleural effusion, edema or  airspace consolidation. IMPRESSION: 1. Nonobstructive bowel gas pattern. 2. Cholelithiasis. 3. Decreased lung volumes. Electronically Signed   By: Signa Kell M.D.   On: 02/27/2020 04:36      Subjective: Alert, no distress  Discharge Exam: Vitals:   02/29/20 2038 03/01/20 0413  BP: (!) 128/86 124/79  Pulse: 75 70  Resp: 19 18  Temp: 98.4 F (36.9 C) 98.2 F (36.8 C)  SpO2: 98% 95%     General: Pt is alert, awake, not in acute distress Cardiovascular: RRR, S1/S2 +, no rubs, no gallops Respiratory: CTA bilaterally, no wheezing, no rhonchi Abdominal: Soft, NT, ND, bowel sounds + Extremities: no edema, no cyanosis    The results of significant diagnostics from this hospitalization (including imaging, microbiology, ancillary and laboratory) are listed below for reference.     Microbiology: Recent Results (from the past 240 hour(s))  SARS Coronavirus 2 by RT PCR (hospital order, performed in [facility] hospital lab) Nasopharyngeal Nasopharyngeal Swab     Status: None   Collection Time: 02/27/20  3:49 AM   Specimen: Nasopharyngeal Swab  Result Value Ref Range Status   SARS Coronavirus 2 NEGATIVE NEGATIVE Final    Comment: (NOTE) SARS-CoV-2 target nucleic acids are NOT DETECTED.  The SARS-CoV-2 RNA is generally detectable in upper and lower respiratory specimens during the acute phase of infection. The lowest concentration of SARS-CoV-2 viral copies this assay can detect is 250 copies / mL. A negative result does not preclude SARS-CoV-2 infection and should not be used as the sole basis for treatment or other patient management decisions.  A negative result may occur with improper specimen collection / handling, submission of specimen other than nasopharyngeal swab, presence of viral mutation(s) within the areas targeted by this assay, and inadequate number of viral copies (<250 copies / mL). A negative result must be combined with clinical observations, patient  history, and epidemiological information.  Fact Sheet for Patients:   BoilerBrush.com.cy  Fact Sheet for Healthcare Providers: https://pope.com/  This test is not yet approved or  cleared by the [place] FDA and has been authorized for detection and/or diagnosis of SARS-CoV-2 by FDA under an Emergency Use Authorization (EUA).  This EUA will remain in effect (meaning this test can be used) for the duration of the COVID-19 declaration under Section 564(b)(1) of the Act, 21 U.S.C. section 360bbb-3(b)(1), unless the authorization is terminated or revoked sooner.  Performed at [facility], [address], [place], [place] [address]      Labs: BNP (last 3 results) No results for input(s): BNP in the last 8760 hours. Basic Metabolic Panel: Recent Labs  Lab 02/27/20 0350 02/27/20 0855 02/28/20 1010 02/29/20 0546 03/01/20  0630  NA 131* 132* 135 134* 134*  K 3.3* 4.1 3.6 3.4* 3.4*  CL 97* 103 104 104 103  CO2 22 17* 22 21* 23  GLUCOSE 210* 129* 147* 129* 136*  BUN 7 14 13 10 9   CREATININE 0.65 0.69 0.85 0.79 0.71  CALCIUM 8.3* 8.2* 8.4* 8.3* 8.5*  MG  --  1.9  --   --   --   PHOS  --  3.9  --   --   --    Liver Function Tests: Recent Labs  Lab 02/27/20 0350 02/27/20 0855 03/01/20 0630  AST 62* 51* 60*  ALT 31 27 27   ALKPHOS 203* 176* 157*  BILITOT 1.3* 1.5* 1.6*  PROT 8.3* 6.8 7.1  ALBUMIN 3.2* 2.7* 2.6*   Recent Labs  Lab 02/27/20 0350  LIPASE 53*   No results for input(s): AMMONIA in the last 168 hours. CBC: Recent Labs  Lab 02/27/20 0350 02/27/20 0350 02/27/20 0855 02/27/20 1447 02/28/20 1010 02/29/20 0546 03/01/20 0630  WBC 12.4*  --  10.2  --  9.1 9.3 8.7  NEUTROABS 8.8*  --   --   --  5.8 5.9 5.1  HGB 13.1   < > 11.8*  11.8* 12.1* 11.2* 11.2* 11.3*  HCT 38.9*   < > 36.4*  36.7* 36.1* 34.2* 33.6* 33.3*  MCV 88.4  --  91.7  --  89.5 88.7 89.3  PLT 154  --  193  --  143* 152 153   < > = values  in this interval not displayed.   Cardiac Enzymes: No results for input(s): CKTOTAL, CKMB, CKMBINDEX, TROPONINI in the last 168 hours. BNP: Invalid input(s): POCBNP CBG: Recent Labs  Lab 02/29/20 1603 02/29/20 2037 03/01/20 0010 03/01/20 0412 03/01/20 0752  GLUCAP 157* 123* 103* 117* 131*   D-Dimer No results for input(s): DDIMER in the last 72 hours. Hgb A1c No results for input(s): HGBA1C in the last 72 hours. Lipid Profile No results for input(s): CHOL, HDL, LDLCALC, TRIG, CHOLHDL, LDLDIRECT in the last 72 hours. Thyroid function studies No results for input(s): TSH, T4TOTAL, T3FREE, THYROIDAB in the last 72 hours.  Invalid input(s): FREET3 Anemia work up No results for input(s): VITAMINB12, FOLATE, FERRITIN, TIBC, IRON, RETICCTPCT in the last 72 hours. Urinalysis    Component Value Date/Time   BILIRUBINUR neg 05/26/2017 1648   PROTEINUR neg 05/26/2017 1648   UROBILINOGEN 0.2 05/26/2017 1648   NITRITE neg 05/26/2017 1648   LEUKOCYTESUR Negative 05/26/2017 1648   Sepsis Labs Invalid input(s): PROCALCITONIN,  WBC,  LACTICIDVEN Microbiology Recent Results (from the past 240 hour(s))  SARS Coronavirus 2 by RT PCR (hospital order, performed in [facility] hospital lab) Nasopharyngeal Nasopharyngeal Swab     Status: None   Collection Time: 02/27/20  3:49 AM   Specimen: Nasopharyngeal Swab  Result Value Ref Range Status   SARS Coronavirus 2 NEGATIVE NEGATIVE Final    Comment: (NOTE) SARS-CoV-2 target nucleic acids are NOT DETECTED.  The SARS-CoV-2 RNA is generally detectable in upper and lower respiratory specimens during the acute phase of infection. The lowest concentration of SARS-CoV-2 viral copies this assay can detect is 250 copies / mL. A negative result does not preclude SARS-CoV-2 infection and should not be used as the sole basis for treatment or other patient management decisions.  A negative result may occur with improper specimen collection / handling,  submission of specimen other than nasopharyngeal swab, presence of viral mutation(s) within the areas targeted by this assay,  and inadequate number of viral copies (<250 copies / mL). A negative result must be combined with clinical observations, patient history, and epidemiological information.  Fact Sheet for Patients:   BoilerBrush.com.cy  Fact Sheet for Healthcare Providers: https://pope.com/  This test is not yet approved or  cleared by the [place] FDA and has been authorized for detection and/or diagnosis of SARS-CoV-2 by FDA under an Emergency Use Authorization (EUA).  This EUA will remain in effect (meaning this test can be used) for the duration of the COVID-19 declaration under Section 564(b)(1) of the Act, 21 U.S.C. section 360bbb-3(b)(1), unless the authorization is terminated or revoked sooner.  Performed at [facility], [address], [place], [place] [address]      Time coordinating discharge: 40 minutes  SIGNED:   Alba Cory, MD  [facility]

## 2020-03-01 NOTE — Progress Notes (Signed)
Discharge instructions reviewed with pt.  Copy of instructions given to pt, pt informed his scripts were sent to his home pharmacy. Pt waiting for his brother to come pick him up, which will be after this time, brother taking their mother to a doctor's appointment at 1400, and then he will be here to pick pt up, will call pt or this RN when he arrives at main entrance.

## 2020-03-01 NOTE — Evaluation (Signed)
Physical Therapy Evaluation Patient Details Name: [name] MRN: [id number] DOB: 27-May-1966 Today's Date: 03/01/2020   History of Present Illness  [name]  is a 54 y.o. male, with history of peptic ulcer disease, nerve damage, hyperlipidemia, hypertension, GERD, COPD, alcohol abuse presented to [facility] ED with complaint of hematemesis  Clinical Impression   Pt admitted with above diagnosis. Comes from home where his mother lives with him and he is her caregiver; single level home with ramped entrance; Presents to PT with dyscoordianted, ataxic gait, incr fall risk;  Pt currently with functional limitations due to the deficits listed below (see PT Problem List). Pt will benefit from skilled PT to increase their independence and safety with mobility to allow discharge to the venue listed below.       Follow Up Recommendations Home health PT    Equipment Recommendations  None recommended by PT (has RW and cane)    Recommendations for Other Services       Precautions / Restrictions Precautions Precautions: Fall Precaution Comments: fall risk reduced with use of RW      Mobility  Bed Mobility                  Transfers Overall transfer level: Needs assistance Equipment used: None Transfers: Sit to/from Stand Sit to Stand: Min assist         General transfer comment: Notable dependence on UEs for balance  Ambulation/Gait Ambulation/Gait assistance: Min assist;Min guard Gait Distance (Feet): 200 Feet Assistive device: None;Rolling walker (2 wheeled) Gait Pattern/deviations: Decreased step length - right;Decreased step length - left;Ataxic;Wide base of support Gait velocity: slow   General Gait Details: Ataxic gait with wide step width to incr BOS; uncoordinated steps; initially without RW, and he reached out for UE support; much better with RW  Stairs            Wheelchair Mobility    Modified Rankin (Stroke Patients Only)       Balance Overall  balance assessment: Needs assistance           Standing balance-Leahy Scale: Poor Standing balance comment: tending to need UE support for balance                             Pertinent Vitals/Pain Pain Assessment: Faces Faces Pain Scale: Hurts little more Pain Location: L foot and ankle; chronic pain from old orthopedic injuries Pain Descriptors / Indicators: Aching Pain Intervention(s): Monitored during session    Home Living Family/patient expects to be discharged to:: Private residence Living Arrangements: Parent (Caregiver for mother) Available Help at Discharge: Other (Comment) (He is caregiver for his mother) Type of Home: House Home Access: Ramped entrance     Home Layout: One level Home Equipment: Walker - 2 wheels;Cane - single point      Prior Function Level of Independence: Independent               Hand Dominance        Extremity/Trunk Assessment   Upper Extremity Assessment Upper Extremity Assessment: Overall WFL for tasks assessed (for simple tasks)    Lower Extremity Assessment Lower Extremity Assessment: Generalized weakness (R and LLE decr coordination)       Communication   Communication: No difficulties  Cognition Arousal/Alertness: Awake/alert Behavior During Therapy: WFL for tasks assessed/performed Overall Cognitive Status: Impaired/Different from baseline Area of Impairment: Safety/judgement  Safety/Judgement: Decreased awareness of safety;Decreased awareness of deficits     General Comments: Unaware of how unsteady he is -- needed some persuasion to use RW, but he did agree      General Comments      Exercises     Assessment/Plan    PT Assessment Patient needs continued PT services  PT Problem List Decreased strength;Decreased activity tolerance;Decreased balance;Decreased mobility;Decreased coordination;Decreased knowledge of use of DME;Decreased safety awareness        PT Treatment Interventions DME instruction;Gait training;Stair training;Functional mobility training;Therapeutic activities;Therapeutic exercise;Balance training;Neuromuscular re-education;Cognitive remediation;Patient/family education    PT Goals (Current goals can be found in the Care Plan section)  Acute Rehab PT Goals Patient Stated Goal: Hopes to go home today PT Goal Formulation: With patient Time For Goal Achievement: 03/08/20 Potential to Achieve Goals: Good    Frequency Min 3X/week   Barriers to discharge        Co-evaluation               AM-PAC PT "6 Clicks" Mobility  Outcome Measure Help needed turning from your back to your side while in a flat bed without using bedrails?: None Help needed moving from lying on your back to sitting on the side of a flat bed without using bedrails?: None Help needed moving to and from a bed to a chair (including a wheelchair)?: A Little Help needed standing up from a chair using your arms (e.g., wheelchair or bedside chair)?: A Little Help needed to walk in hospital room?: A Little Help needed climbing 3-5 steps with a railing? : A Little 6 Click Score: 20    End of Session Equipment Utilized During Treatment: Gait belt Activity Tolerance: Patient tolerated treatment well Patient left: in chair;with call bell/phone within reach;with chair alarm set Nurse Communication: Mobility status PT Visit Diagnosis: Unsteadiness on feet (R26.81);Other abnormalities of gait and mobility (R26.89)    Time: 6384-6659 PT Time Calculation (min) (ACUTE ONLY): 31 min   Charges:   PT Evaluation $PT Eval Moderate Complexity: 1 Mod PT Treatments $Gait Training: 8-22 mins        Van Clines, PT  Acute Rehabilitation Services Pager [phone] Office [phone]   Levi Aland 03/01/2020, 11:21 AM

## 2020-03-01 NOTE — Progress Notes (Signed)
Informed CM Debbie Swiss of pt going home, MD ordered HHPT per PT recommendations. Pt is agreeable per PT.

## 2020-03-01 NOTE — Plan of Care (Signed)
  Problem: Pain Managment: Goal: General experience of comfort will improve Outcome: Progressing   

## 2020-03-01 NOTE — Progress Notes (Signed)
Pt's brother here at main entrance. Pt d/c'd via wheelchair with his belongings, escorted by unit staff.

## 2020-03-01 NOTE — Care Management Important Message (Signed)
Important Message  Patient Details  Name: [name] MRN: [id number] Date of Birth: [birth year]   Medicare Important Message Given:  Yes     Dorena Bodo 03/01/2020, 3:11 PM

## 2020-03-02 ENCOUNTER — Telehealth: Payer: Self-pay

## 2020-03-02 LAB — SURGICAL PATHOLOGY

## 2020-03-02 NOTE — Telephone Encounter (Signed)
2nd/final attempt- Called to complete TCM and schedule follow up visit. No answer. Unable to leave message to return call.

## 2020-03-02 NOTE — Telephone Encounter (Signed)
1st attempt- Called to complete TCM and schedule hospital follow up visit. A woman answered the phone and stated that the patient was asleep. She would take the message and have him give me a call when he wakes up.

## 2020-03-02 NOTE — Telephone Encounter (Signed)
noted 

## 2020-03-03 NOTE — Discharge Summary (Signed)
Physician Discharge Summary  [name] WUJ:[id number] DOB: 1966-07-16 DOA: 02/27/2020  PCP: Bob Munroe, NP  Admit date: 02/27/2020 Discharge date: 03/03/2020  Admitted From: Home  Disposition:  Home   Recommendations for Outpatient Follow-up:  1. Follow up with PCP in 1-2 weeks 2. Please obtain BMP/CBC in one week 3. Needs to follow up with GI for further care of cirrhosis.   Home Health: yes  Discharge Condition: Stable.  CODE STATUS: full code Diet recommendation: Heart Healthy  Brief/Interim Summary: [name] a54 y.o.male,with history of peptic ulcer disease, nerve damage, hyperlipidemia, hypertension, GERD, COPD, alcohol abuse presentedto [facility] with complaint of hematemesis. Patient reports that he had just laid down to go to bed around 1:00 in the morning when he felt nauseous went to the bathroom and threw up dark red blood. he continued to have episodes of bloody emesis for total of 4 episodes. No prior history of such.He reports he had melena at the time that he had the bleeding ulcer. Bleeding ulcer was more than 10 years ago. Patient does not have any known liver disease, or esophageal varices.   Patient reports that he drinks 2 to 3 22 ounce beers 1-2 times daily.Patient smokes 1/2 pack to a pack a day. Patient denies illicit drug use.  He was transferred to [facility] for GI assistance. Underwent EGD today.Rest as below.  Hematemesis/upper GI bleed: Presented with 4 episodes of hematemesis. Hemoglobin 13.1at presentation.Underwent EGD which shows grade 1 esophageal varices, hiatal hernia, reflux esophagitis, gastritis, no active bleeding. GI recommends continuing octreotide for total 72 hours which will be tomorrow night. We will also continue Protonix drip until GI recommend switching to oral. Dropped hemoglobin to 11.2. No indication of transfusion. Monitor daily.   Alcoholic liver disease/liver cirrhosis: Stable.Continue Rocephin  for total of 5 days per GI recommendation due to new diagnosis of liver cirrhosis. Needs follow with GI.  Discharge on ciprofloxacin for 3 days.  Prescription called in to pharmacy. Patient will be informed.   Cholelithiasis: Stable with no complaints.  Hypokalemia:3.4. Replace orally.  Mild protein calorie malnutrition:Nutrition on board.  EtOH dependency: No signs of withdrawal. CIWA less than 2 with last 24 hours and has not required any Ativan. Continue CIWA protocol with as needed Ativan. No evidence of withdrawal.   Type 2 diabetes mellitus: Takes Januvia and glipizide at home. Oral medications on hold. Blood sugar controlled on SSI.   Discharge Diagnoses:  Active Problems:   HTN (hypertension)   Alcohol abuse   DM (diabetes mellitus), type 2 (HCC)   Hematemesis with nausea   LFT elevation   Melena   Acute posthemorrhagic anemia   Secondary esophageal varices without bleeding (HCC)   Gastritis and gastroduodenitis   Alcoholic cirrhosis of liver without ascites [facility])    Discharge Instructions  Discharge Instructions    Diet - low sodium heart healthy   Complete by: As directed    Increase activity slowly   Complete by: As directed      Allergies as of 03/01/2020      Reactions   Glipizide Other (See Comments)   Sore throat   Penicillins Itching, Rash   Has patient had a PCN reaction causing immediate rash, facial/tongue/throat swelling, SOB or lightheadedness with hypotension: No Has patient had a PCN reaction causing severe rash involving mucus membranes or skin necrosis: No Has patient had a PCN reaction that required hospitalization No Has patient had a PCN reaction occurring within the last 10 years: No If  all of the above answers are "NO", then may proceed with Cephalosporin use.      Medication List    STOP taking these medications   aspirin 500 MG tablet   atorvastatin 10 MG tablet Commonly known as: LIPITOR   fenofibrate 160 MG  tablet   fluticasone 50 MCG/BLIST diskus inhaler Commonly known as: FLOVENT DISKUS   glipiZIDE 10 MG tablet Commonly known as: GLUCOTROL   Januvia 50 MG tablet Generic drug: sitaGLIPtin   simvastatin 40 MG tablet Commonly known as: ZOCOR     TAKE these medications   albuterol 108 (90 Base) MCG/ACT inhaler Commonly known as: VENTOLIN HFA Inhale 2 puffs into the lungs every 6 (six) hours as needed.   amLODipine 10 MG tablet Commonly known as: NORVASC Take 1 tablet (10 mg total) by mouth daily.   Fluticasone-Salmeterol 100-50 MCG/DOSE Aepb Commonly known as: ADVAIR Inhale 1 puff into the lungs every 12 (twelve) hours.   folic acid 1 MG tablet Commonly known as: FOLVITE Take 1 tablet (1 mg total) by mouth daily.   glimepiride 2 MG tablet Commonly known as: AMARYL Take 2 mg by mouth every morning.   hydrocortisone cream 1 % Apply 1 application topically daily as needed (for itching/ezcema/rash).   losartan 25 MG tablet Commonly known as: COZAAR Take 25 mg by mouth daily.   multivitamin with minerals Tabs tablet Take 1 tablet by mouth daily. What changed: additional instructions   nicotine 14 mg/24hr patch Commonly known as: NICODERM CQ - dosed in mg/24 hours Place 1 patch (14 mg total) onto the skin daily.   pantoprazole 40 MG tablet Commonly known as: PROTONIX Take 1 tablet (40 mg total) by mouth 2 (two) times daily.   thiamine 100 MG tablet Take 1 tablet (100 mg total) by mouth daily.       Follow-up Information    Bob MunroeBaity, Bob W, NP Follow up in 1 week(s).   Specialties: Internal Medicine, Emergency Medicine Contact information: [address] [place] [address] [phone]        [name], Bob M, PA-C Follow up on 03/21/2020.   Specialty: Gastroenterology Why: 11AM appointment for follow up liver disease and gastric bleeding with PA for GI [name] Contact information: [address] [place]  [id number] [phone]              Allergies  Allergen Reactions  . Glipizide Other (See Comments)    Sore throat  . Penicillins Itching and Rash    Has patient had a PCN reaction causing immediate rash, facial/tongue/throat swelling, SOB or lightheadedness with hypotension: No Has patient had a PCN reaction causing severe rash involving mucus membranes or skin necrosis: No Has patient had a PCN reaction that required hospitalization No Has patient had a PCN reaction occurring within the last 10 years: No If all of the above answers are "NO", then may proceed with Cephalosporin use.     Consultations: GI  Procedures/Studies: US Abdomen Limited  Result Date: 02/28/2020 CLINICAL DATA:  Elevated LFT EXAM: ULTRASOUND ABDOMEN LIMITED RIGHT UPPER QUADRANT COMPARISON:  None. FINDINGS: Gallbladder: Multiple shadowing stones measuring up to 1.5 cm. Increased wall thickness up to 7.8 mm. Negative sonographic Murphy. Common bile duct: Diameter: 5.3 mm Liver: Liver is slightly echogenic. Nodular hepatic contour suspicious for cirrhosis. Portal vein is patent on color Doppler imaging with normal direction of blood flow towards the liver. Other: None. IMPRESSION: 1. Cholelithiasis with increased gallbladder wall thickness but negative sonographic Murphy.  Findings are nonspecific and could be secondary to cholecystitis, liver disease, or edema forming states. 2. Suspected liver cirrhosis Electronically Signed   By: Jasmine Pang M.D.   On: 02/28/2020 19:44   DG Abdomen Acute W/Chest  Result Date: 02/27/2020 CLINICAL DATA:  Vomiting blood that began 1 hour ago. History of bleeding ulcers. EXAM: DG ABDOMEN ACUTE W/ 1V CHEST COMPARISON:  01/23/2017 FINDINGS: There is no evidence of dilated bowel loops or free intraperitoneal air. Calcified gallstones are identified within the right upper quadrant of the abdomen. Mild cardiac enlargement. Decreased lung volumes. No pleural effusion, edema or airspace  consolidation. IMPRESSION: 1. Nonobstructive bowel gas pattern. 2. Cholelithiasis. 3. Decreased lung volumes. Electronically Signed   By: Signa Kell M.D.   On: 02/27/2020 04:36      Subjective: Feeling well. No distress.   Discharge Exam: Vitals:   03/01/20 0413 03/01/20 0948  BP: 124/79 (!) 137/97  Pulse: 70 81  Resp: 18 18  Temp: 98.2 F (36.8 C) 98.5 F (36.9 C)  SpO2: 95% 100%     General: Pt is alert, awake, not in acute distress Cardiovascular: RRR, S1/S2 +, no rubs, no gallops Respiratory: CTA bilaterally, no wheezing, no rhonchi Abdominal: Soft, NT, ND, bowel sounds + Extremities: no edema, no cyanosis    The results of significant diagnostics from this hospitalization (including imaging, microbiology, ancillary and laboratory) are listed below for reference.     Microbiology: Recent Results (from the past 240 hour(s))  SARS Coronavirus 2 by RT PCR (hospital order, performed in [facility] hospital lab) Nasopharyngeal Nasopharyngeal Swab     Status: None   Collection Time: 02/27/20  3:49 AM   Specimen: Nasopharyngeal Swab  Result Value Ref Range Status   SARS Coronavirus 2 NEGATIVE NEGATIVE Final    Comment: (NOTE) SARS-CoV-2 target nucleic acids are NOT DETECTED.  The SARS-CoV-2 RNA is generally detectable in upper and lower respiratory specimens during the acute phase of infection. The lowest concentration of SARS-CoV-2 viral copies this assay can detect is 250 copies / mL. A negative result does not preclude SARS-CoV-2 infection and should not be used as the sole basis for treatment or other patient management decisions.  A negative result may occur with improper specimen collection / handling, submission of specimen other than nasopharyngeal swab, presence of viral mutation(s) within the areas targeted by this assay, and inadequate number of viral copies (<250 copies / mL). A negative result must be combined with clinical observations, patient  history, and epidemiological information.  Fact Sheet for Patients:   [employer]iler[employer]com.cy  Fact Sheet for Healthcare Providers: https://pope.com/  This test is not yet approved or  cleared by the [place] FDA and has been authorized for detection and/or diagnosis of SARS-CoV-2 by FDA under an Emergency Use Authorization (EUA).  This EUA will remain in effect (meaning this test can be used) for the duration of the COVID-19 declaration under Section 564(b)(1) of the Act, 21 U.S.C. section 360bbb-3(b)(1), unless the authorization is terminated or revoked sooner.  Performed at [facility], [address], [place], [place] [address]      Labs: BNP (last 3 results) No results for input(s): BNP in the last 8760 hours. Basic Metabolic Panel: Recent Labs  Lab 02/27/20 0350 02/27/20 0855 02/28/20 1010 02/29/20 0546 03/01/20 0630  NA 131* 132* 135 134* 134*  K 3.3* 4.1 3.6 3.4* 3.4*  CL 97* 103 104 104 103  CO2 22 17* 22 21* 23  GLUCOSE 210* 129* 147* 129*  136*  BUN 7 14 13 10 9   CREATININE 0.65 0.69 0.85 0.79 0.71  CALCIUM 8.3* 8.2* 8.4* 8.3* 8.5*  MG  --  1.9  --   --   --   PHOS  --  3.9  --   --   --    Liver Function Tests: Recent Labs  Lab 02/27/20 0350 02/27/20 0855 03/01/20 0630  AST 62* 51* 60*  ALT 31 27 27   ALKPHOS 203* 176* 157*  BILITOT 1.3* 1.5* 1.6*  PROT 8.3* 6.8 7.1  ALBUMIN 3.2* 2.7* 2.6*   Recent Labs  Lab 02/27/20 0350  LIPASE 53*   No results for input(s): AMMONIA in the last 168 hours. CBC: Recent Labs  Lab 02/27/20 0350 02/27/20 0350 02/27/20 0855 02/27/20 1447 02/28/20 1010 02/29/20 0546 03/01/20 0630  WBC 12.4*  --  10.2  --  9.1 9.3 8.7  NEUTROABS 8.8*  --   --   --  5.8 5.9 5.1  HGB 13.1   < > 11.8*  11.8* 12.1* 11.2* 11.2* 11.3*  HCT 38.9*   < > 36.4*  36.7* 36.1* 34.2* 33.6* 33.3*  MCV 88.4  --  91.7  --  89.5 88.7 89.3  PLT 154  --  193  --  143* 152 153   < > = values  in this interval not displayed.   Cardiac Enzymes: No results for input(s): CKTOTAL, CKMB, CKMBINDEX, TROPONINI in the last 168 hours. BNP: Invalid input(s): POCBNP CBG: Recent Labs  Lab 02/29/20 2037 03/01/20 0010 03/01/20 0412 03/01/20 0752 03/01/20 1117  GLUCAP 123* 103* 117* 131* 159*   D-Dimer No results for input(s): DDIMER in the last 72 hours. Hgb A1c No results for input(s): HGBA1C in the last 72 hours. Lipid Profile No results for input(s): CHOL, HDL, LDLCALC, TRIG, CHOLHDL, LDLDIRECT in the last 72 hours. Thyroid function studies No results for input(s): TSH, T4TOTAL, T3FREE, THYROIDAB in the last 72 hours.  Invalid input(s): FREET3 Anemia work up No results for input(s): VITAMINB12, FOLATE, FERRITIN, TIBC, IRON, RETICCTPCT in the last 72 hours. Urinalysis    Component Value Date/Time   BILIRUBINUR neg 05/26/2017 1648   PROTEINUR neg 05/26/2017 1648   UROBILINOGEN 0.2 05/26/2017 1648   NITRITE neg 05/26/2017 1648   LEUKOCYTESUR Negative 05/26/2017 1648   Sepsis Labs Invalid input(s): PROCALCITONIN,  WBC,  LACTICIDVEN Microbiology Recent Results (from the past 240 hour(s))  SARS Coronavirus 2 by RT PCR (hospital order, performed in [facility] hospital lab) Nasopharyngeal Nasopharyngeal Swab     Status: None   Collection Time: 02/27/20  3:49 AM   Specimen: Nasopharyngeal Swab  Result Value Ref Range Status   SARS Coronavirus 2 NEGATIVE NEGATIVE Final    Comment: (NOTE) SARS-CoV-2 target nucleic acids are NOT DETECTED.  The SARS-CoV-2 RNA is generally detectable in upper and lower respiratory specimens during the acute phase of infection. The lowest concentration of SARS-CoV-2 viral copies this assay can detect is 250 copies / mL. A negative result does not preclude SARS-CoV-2 infection and should not be used as the sole basis for treatment or other patient management decisions.  A negative result may occur with improper specimen collection / handling,  submission of specimen other than nasopharyngeal swab, presence of viral mutation(s) within the areas targeted by this assay, and inadequate number of viral copies (<250 copies / mL). A negative result must be combined with clinical observations, patient history, and epidemiological information.  Fact Sheet for Patients:   [facility]  Fact Sheet  for Healthcare Providers: https://pope.com/  This test is not yet approved or  cleared by the [place] and has been authorized for detection and/or diagnosis of SARS-CoV-2 by FDA under an Emergency Use Authorization (EUA).  This EUA will remain in effect (meaning this test can be used) for the duration of the COVID-19 declaration under Section 564(b)(1) of the Act, 21 U.S.C. section 360bbb-3(b)(1), unless the authorization is terminated or revoked sooner.  Performed at [facility], [address] [place], [place] [address]      Time coordinating discharge: 40 minutes  SIGNED:   Alba Cory, MD  [facility]

## 2020-03-07 DIAGNOSIS — K219 Gastro-esophageal reflux disease without esophagitis: Secondary | ICD-10-CM | POA: Diagnosis not present

## 2020-03-07 DIAGNOSIS — E441 Mild protein-calorie malnutrition: Secondary | ICD-10-CM | POA: Diagnosis not present

## 2020-03-07 DIAGNOSIS — K8020 Calculus of gallbladder without cholecystitis without obstruction: Secondary | ICD-10-CM | POA: Diagnosis not present

## 2020-03-07 DIAGNOSIS — D62 Acute posthemorrhagic anemia: Secondary | ICD-10-CM | POA: Diagnosis not present

## 2020-03-07 DIAGNOSIS — Z7984 Long term (current) use of oral hypoglycemic drugs: Secondary | ICD-10-CM | POA: Diagnosis not present

## 2020-03-07 DIAGNOSIS — M1990 Unspecified osteoarthritis, unspecified site: Secondary | ICD-10-CM | POA: Diagnosis not present

## 2020-03-07 DIAGNOSIS — J449 Chronic obstructive pulmonary disease, unspecified: Secondary | ICD-10-CM | POA: Diagnosis not present

## 2020-03-07 DIAGNOSIS — E119 Type 2 diabetes mellitus without complications: Secondary | ICD-10-CM | POA: Diagnosis not present

## 2020-03-07 DIAGNOSIS — G4730 Sleep apnea, unspecified: Secondary | ICD-10-CM | POA: Diagnosis not present

## 2020-03-07 DIAGNOSIS — M109 Gout, unspecified: Secondary | ICD-10-CM | POA: Diagnosis not present

## 2020-03-07 DIAGNOSIS — Z8711 Personal history of peptic ulcer disease: Secondary | ICD-10-CM | POA: Diagnosis not present

## 2020-03-07 DIAGNOSIS — E785 Hyperlipidemia, unspecified: Secondary | ICD-10-CM | POA: Diagnosis not present

## 2020-03-07 DIAGNOSIS — I10 Essential (primary) hypertension: Secondary | ICD-10-CM | POA: Diagnosis not present

## 2020-03-16 DIAGNOSIS — E441 Mild protein-calorie malnutrition: Secondary | ICD-10-CM | POA: Diagnosis not present

## 2020-03-16 DIAGNOSIS — J449 Chronic obstructive pulmonary disease, unspecified: Secondary | ICD-10-CM | POA: Diagnosis not present

## 2020-03-16 DIAGNOSIS — D62 Acute posthemorrhagic anemia: Secondary | ICD-10-CM | POA: Diagnosis not present

## 2020-03-16 DIAGNOSIS — M109 Gout, unspecified: Secondary | ICD-10-CM | POA: Diagnosis not present

## 2020-03-16 DIAGNOSIS — K219 Gastro-esophageal reflux disease without esophagitis: Secondary | ICD-10-CM | POA: Diagnosis not present

## 2020-03-16 DIAGNOSIS — E119 Type 2 diabetes mellitus without complications: Secondary | ICD-10-CM | POA: Diagnosis not present

## 2020-03-16 DIAGNOSIS — K8020 Calculus of gallbladder without cholecystitis without obstruction: Secondary | ICD-10-CM | POA: Diagnosis not present

## 2020-03-16 DIAGNOSIS — Z7984 Long term (current) use of oral hypoglycemic drugs: Secondary | ICD-10-CM | POA: Diagnosis not present

## 2020-03-16 DIAGNOSIS — M1990 Unspecified osteoarthritis, unspecified site: Secondary | ICD-10-CM | POA: Diagnosis not present

## 2020-03-16 DIAGNOSIS — E785 Hyperlipidemia, unspecified: Secondary | ICD-10-CM | POA: Diagnosis not present

## 2020-03-16 DIAGNOSIS — I10 Essential (primary) hypertension: Secondary | ICD-10-CM | POA: Diagnosis not present

## 2020-03-16 DIAGNOSIS — Z8711 Personal history of peptic ulcer disease: Secondary | ICD-10-CM | POA: Diagnosis not present

## 2020-03-16 DIAGNOSIS — G4730 Sleep apnea, unspecified: Secondary | ICD-10-CM | POA: Diagnosis not present

## 2020-03-30 DIAGNOSIS — Z8711 Personal history of peptic ulcer disease: Secondary | ICD-10-CM | POA: Diagnosis not present

## 2020-03-30 DIAGNOSIS — E441 Mild protein-calorie malnutrition: Secondary | ICD-10-CM | POA: Diagnosis not present

## 2020-03-30 DIAGNOSIS — K8020 Calculus of gallbladder without cholecystitis without obstruction: Secondary | ICD-10-CM | POA: Diagnosis not present

## 2020-03-30 DIAGNOSIS — I10 Essential (primary) hypertension: Secondary | ICD-10-CM | POA: Diagnosis not present

## 2020-03-30 DIAGNOSIS — Z7984 Long term (current) use of oral hypoglycemic drugs: Secondary | ICD-10-CM | POA: Diagnosis not present

## 2020-03-30 DIAGNOSIS — G4730 Sleep apnea, unspecified: Secondary | ICD-10-CM | POA: Diagnosis not present

## 2020-03-30 DIAGNOSIS — E119 Type 2 diabetes mellitus without complications: Secondary | ICD-10-CM | POA: Diagnosis not present

## 2020-03-30 DIAGNOSIS — D62 Acute posthemorrhagic anemia: Secondary | ICD-10-CM | POA: Diagnosis not present

## 2020-03-30 DIAGNOSIS — E785 Hyperlipidemia, unspecified: Secondary | ICD-10-CM | POA: Diagnosis not present

## 2020-03-30 DIAGNOSIS — M109 Gout, unspecified: Secondary | ICD-10-CM | POA: Diagnosis not present

## 2020-03-30 DIAGNOSIS — M1990 Unspecified osteoarthritis, unspecified site: Secondary | ICD-10-CM | POA: Diagnosis not present

## 2020-03-30 DIAGNOSIS — J449 Chronic obstructive pulmonary disease, unspecified: Secondary | ICD-10-CM | POA: Diagnosis not present

## 2020-03-30 DIAGNOSIS — K219 Gastro-esophageal reflux disease without esophagitis: Secondary | ICD-10-CM | POA: Diagnosis not present

## 2020-06-20 ENCOUNTER — Telehealth: Payer: Self-pay

## 2020-06-20 NOTE — Telephone Encounter (Signed)
[name] with [facility] left v/m; [name] has found a HH order that was missed and not signed. Before sending to Pamala Hurry NP [name] wants to verify that Pamala Hurry NP will sign HH order. [name] noted in v/m that Pamala Hurry NP did sign a HH order in Aug 2021. Pt last seen 06/11/2018 for medicare annual visit.Please advise.

## 2020-06-21 NOTE — Telephone Encounter (Signed)
He has not been seen in 2 years, will not sign HH order

## 2020-06-21 NOTE — Telephone Encounter (Signed)
Spoke to [name] and she is aware as instructed

## 2021-04-01 IMAGING — DX DG ABDOMEN ACUTE W/ 1V CHEST
3 series · 3 of 3 positions shown · non-contrast
Comparison: 01/23/2017

CLINICAL DATA: Vomiting blood that began 1 hour ago. History of
bleeding ulcers.

EXAM:
DG ABDOMEN ACUTE W/ 1V CHEST

[chest pa]
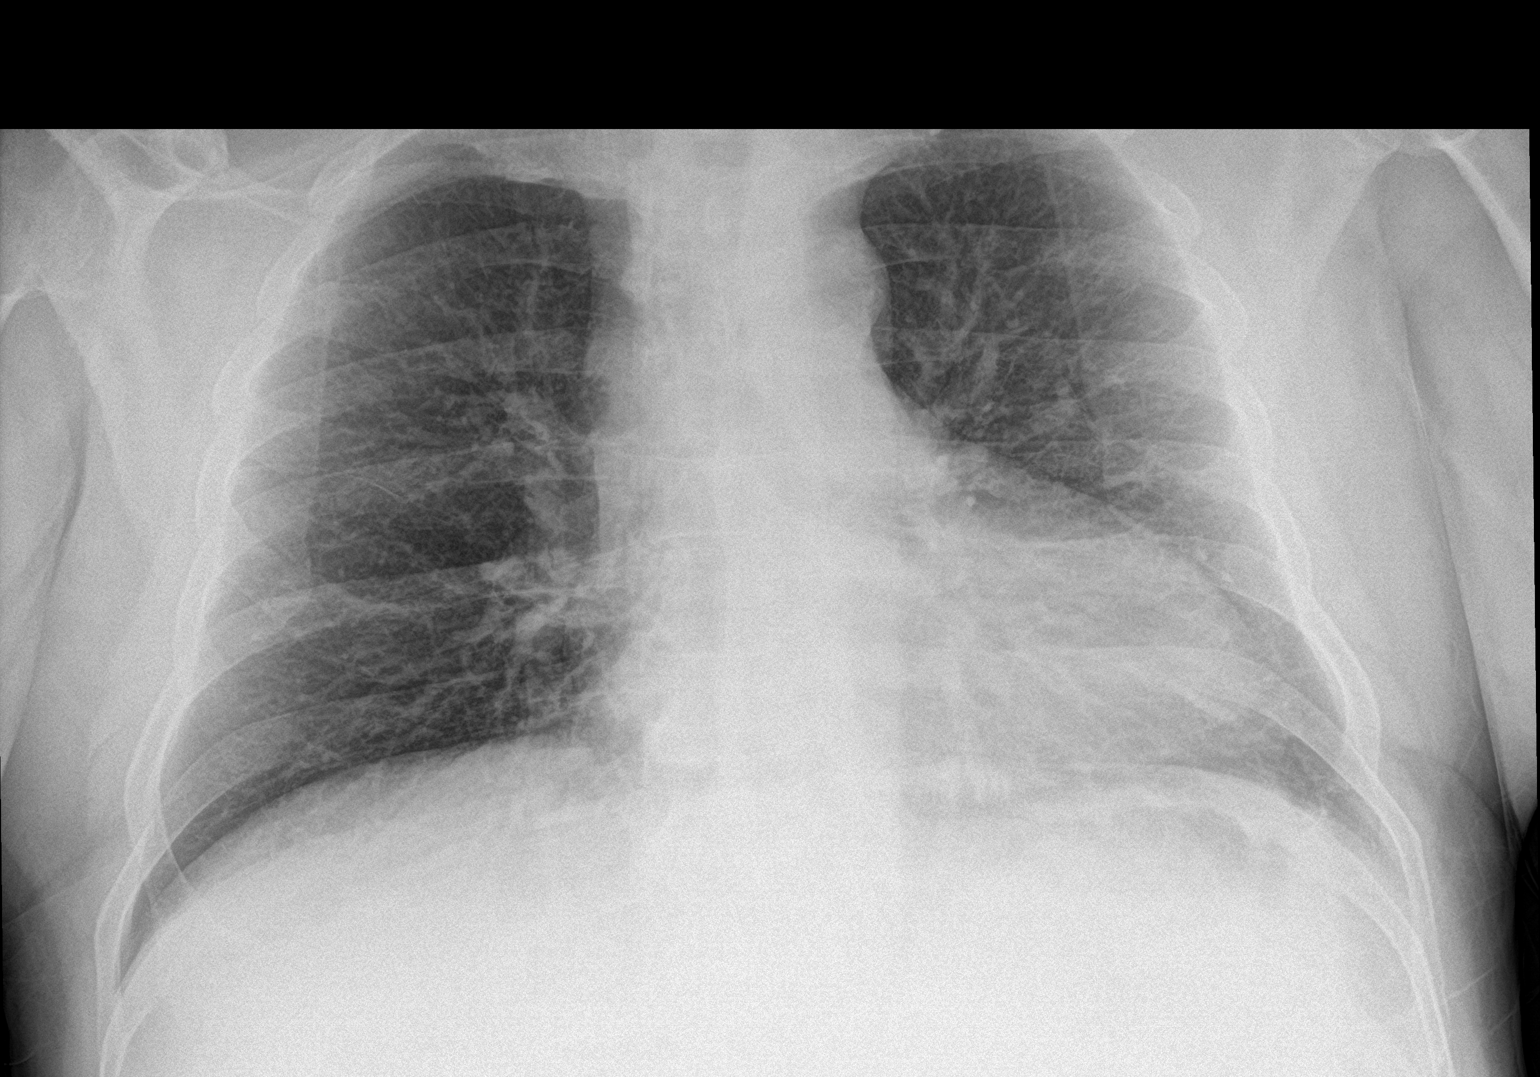

[abdomen erect]
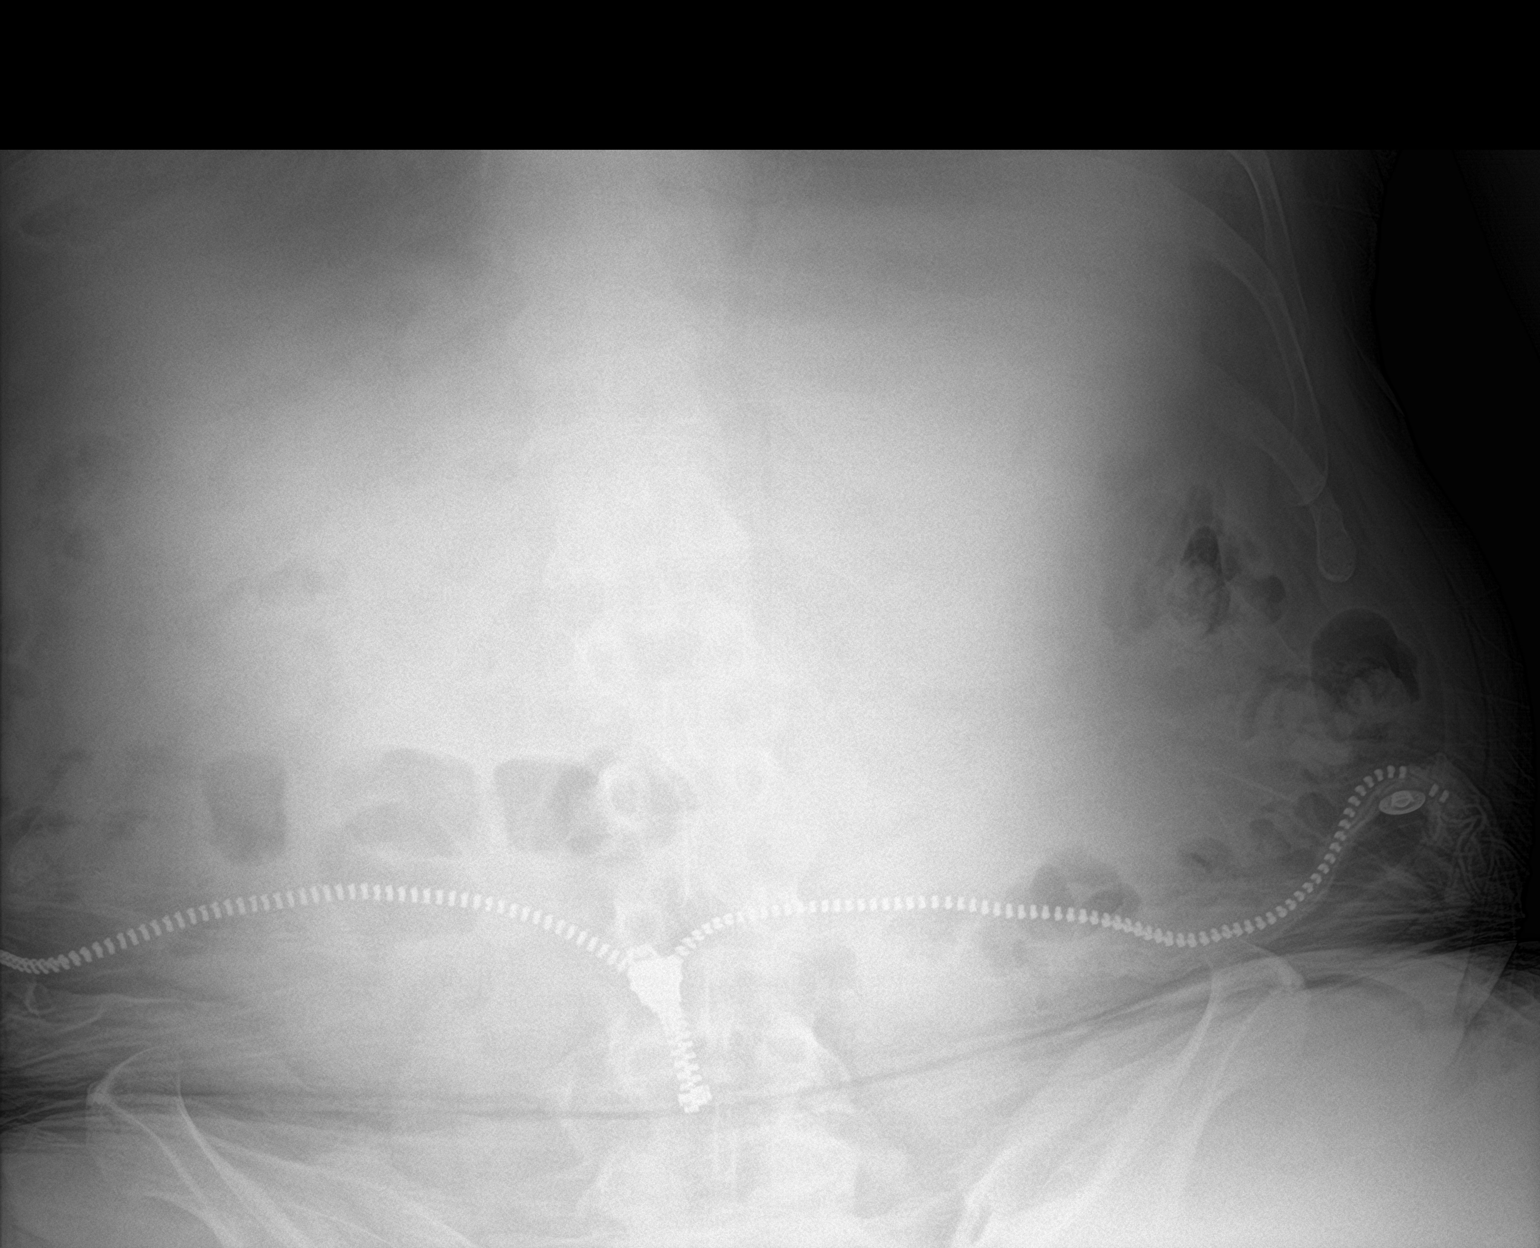

[abdomen supine]
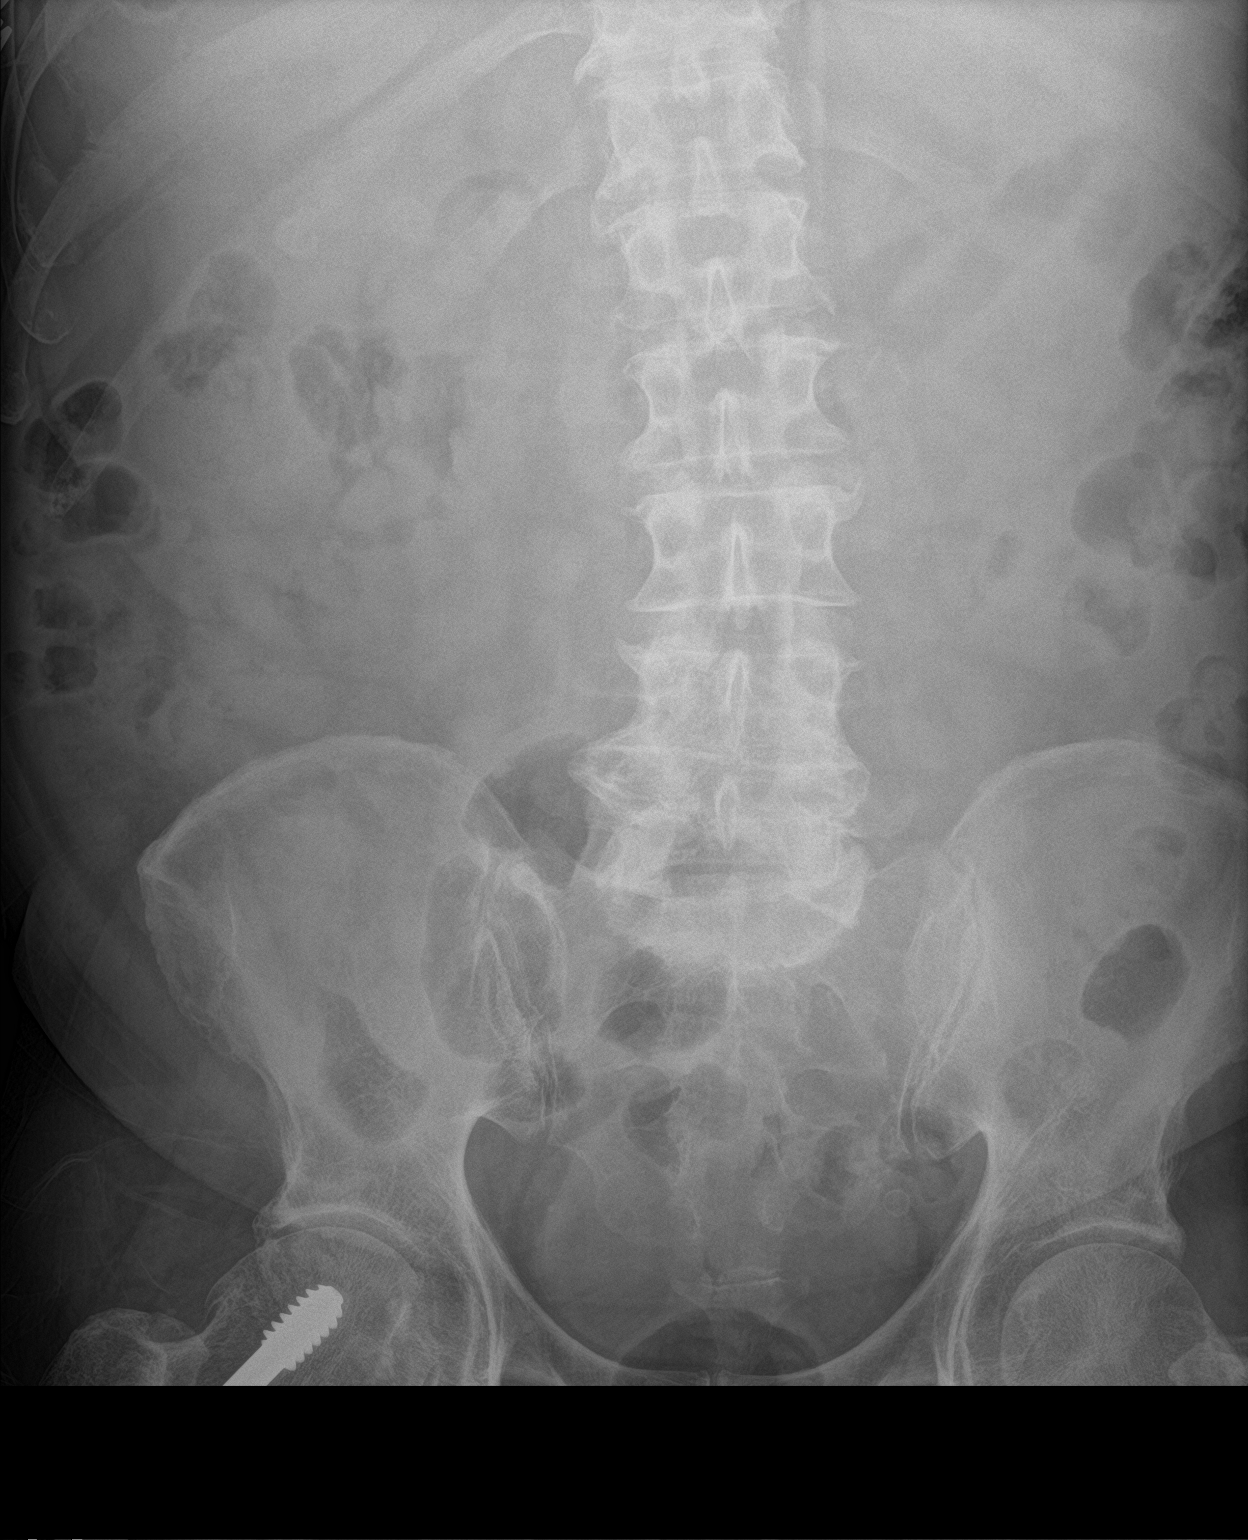

[3 of 3 positions shown; findings below may reference images not displayed]

FINDINGS: There is no evidence of dilated bowel loops or free intraperitoneal
air. Calcified gallstones are identified within the right upper
quadrant of the abdomen. Mild cardiac enlargement. Decreased lung
volumes. No pleural effusion, edema or airspace consolidation.
IMPRESSION: 1. Nonobstructive bowel gas pattern.
2. Cholelithiasis.
3. Decreased lung volumes.

## 2021-04-02 IMAGING — US US ABDOMEN LIMITED
1 series · 14 of 25 positions shown · non-contrast
Comparison: None.

CLINICAL DATA: Elevated LFT

EXAM:
ULTRASOUND ABDOMEN LIMITED RIGHT UPPER QUADRANT

[Series 1: us abdomen limited · 14 of 32 slices shown]
[im 1/32]
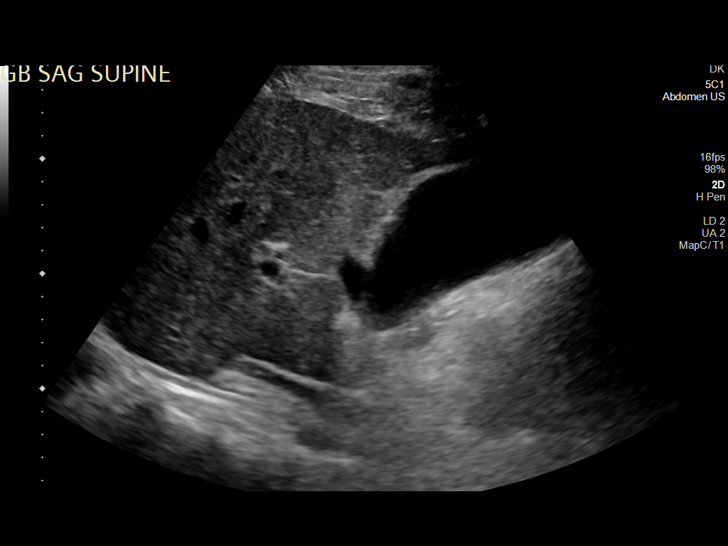
[im 3/32]
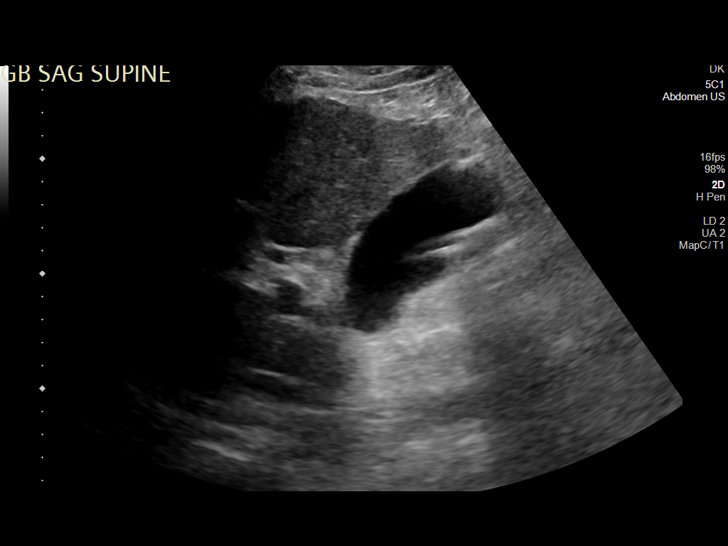
[im 6/32]
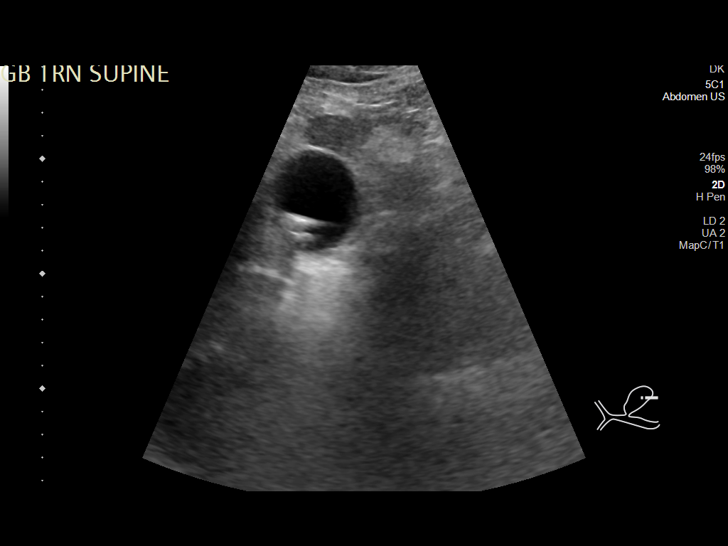
[im 8/32]
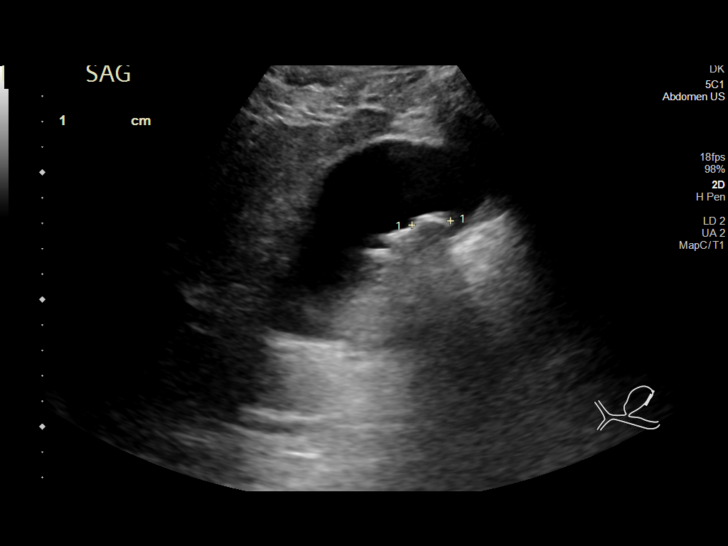
[im 11/32]
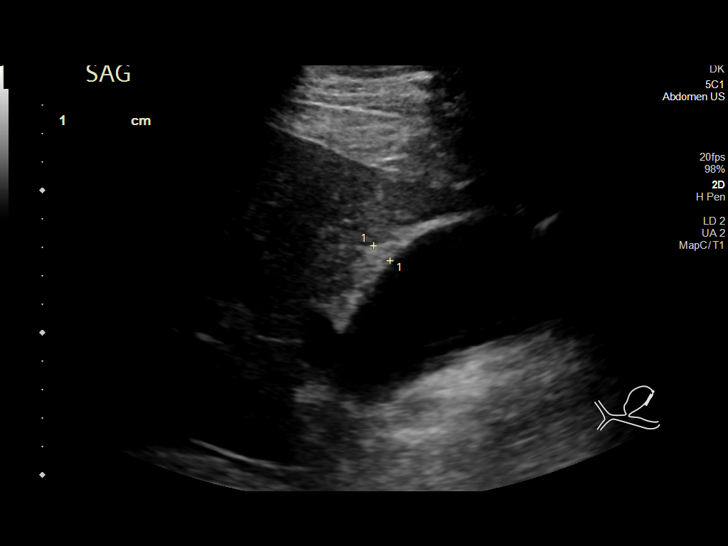
[im 12/32]
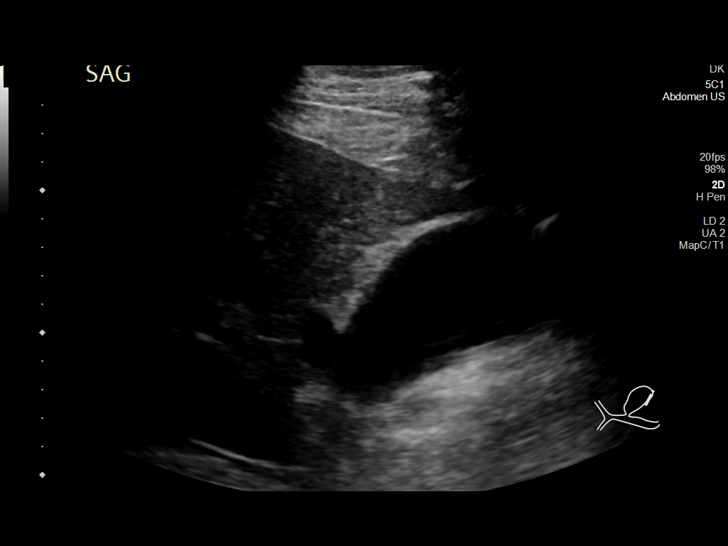
[im 15/32]
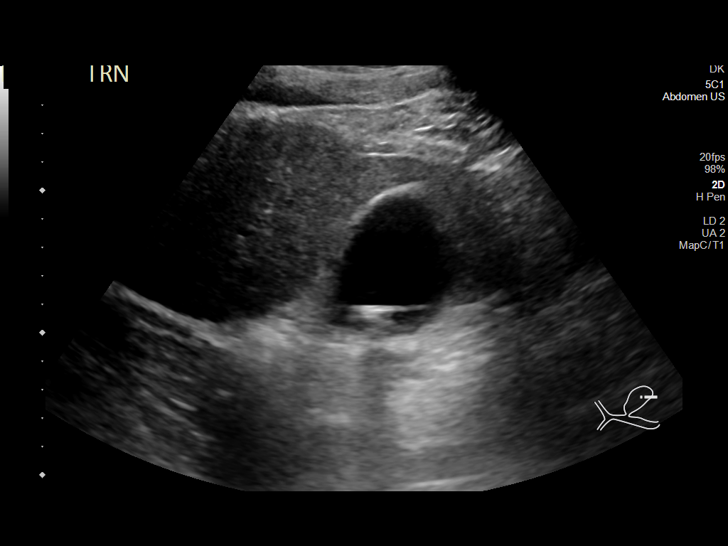
[im 17/32]
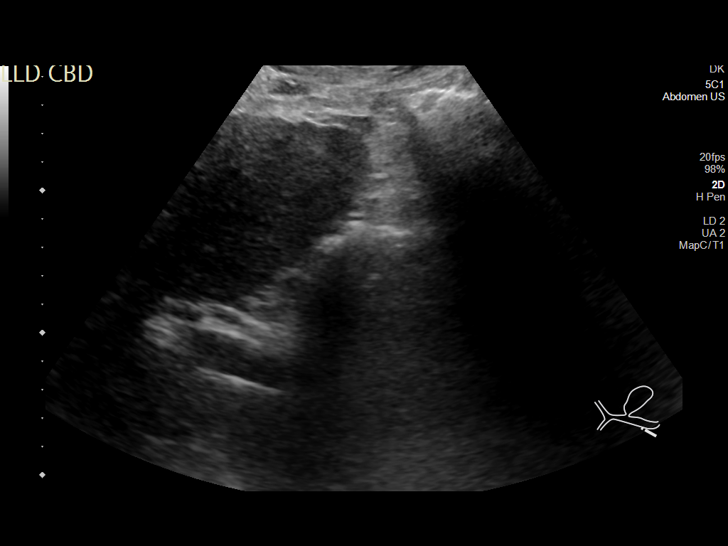
[im 20/32]
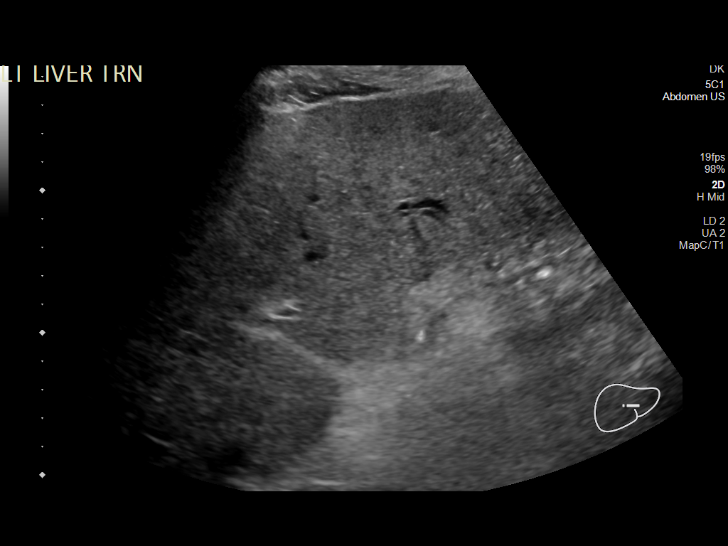
[im 21/32]
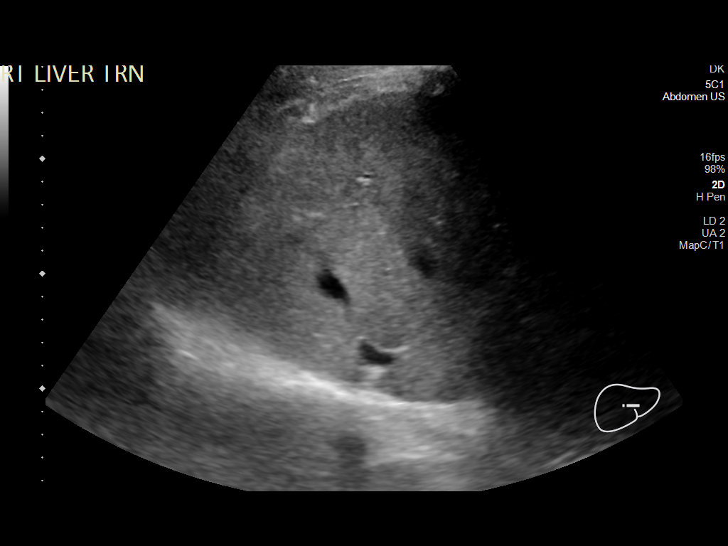
[im 24/32]
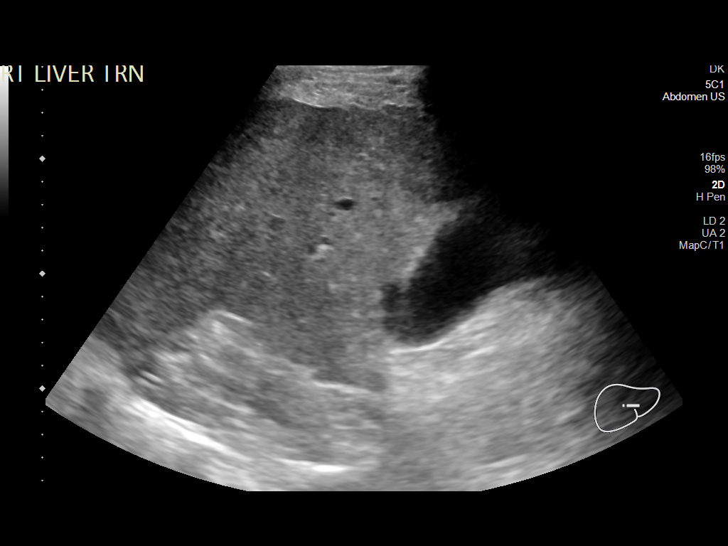
[im 26/32]
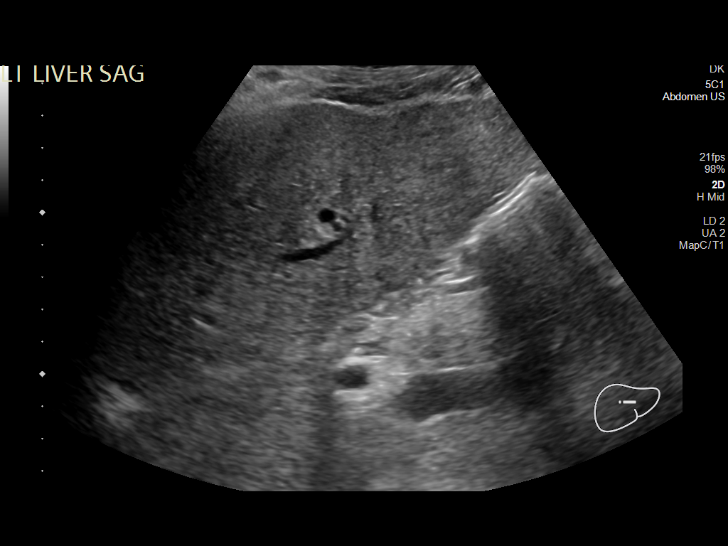
[im 29/32]
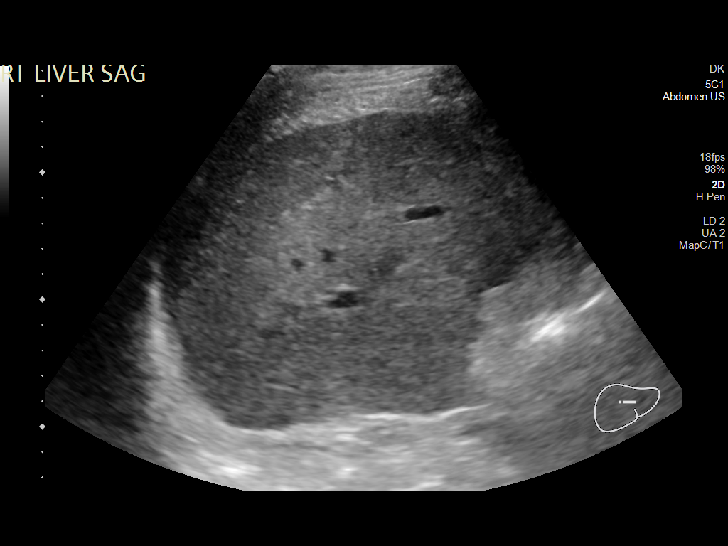
[im 32/32]
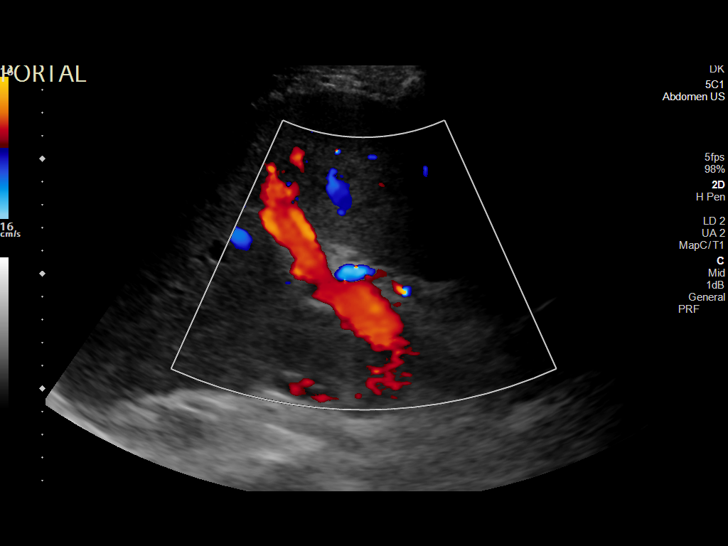

[14 of 25 positions shown; findings below may reference images not displayed]

FINDINGS: Gallbladder:

Multiple shadowing stones measuring up to 1.5 cm. Increased wall
thickness up to 7.8 mm. Negative sonographic Murphy.

Common bile duct:

Diameter: 5.3 mm

Liver:

Liver is slightly echogenic. Nodular hepatic contour suspicious for
cirrhosis. Portal vein is patent on color Doppler imaging with
normal direction of blood flow towards the liver.

Other: None.
IMPRESSION: 1. Cholelithiasis with increased gallbladder wall thickness but
negative sonographic Murphy. Findings are nonspecific and could be
secondary to cholecystitis, liver disease, or edema forming states.
2. Suspected liver cirrhosis

## 2023-11-22 ENCOUNTER — Emergency Department
Admission: EM | Admit: 2023-11-22 | Discharge: 2023-11-22 | Disposition: A | Discharge status: Home / Self Care | Attending: Emergency Medicine | Admitting: Emergency Medicine

## 2023-11-22 ENCOUNTER — Other Ambulatory Visit: Payer: Self-pay

## 2023-11-22 DIAGNOSIS — R1013 Epigastric pain: Secondary | ICD-10-CM | POA: Insufficient documentation

## 2023-11-22 DIAGNOSIS — R1010 Upper abdominal pain, unspecified: Secondary | ICD-10-CM | POA: Diagnosis present

## 2023-11-22 DIAGNOSIS — J449 Chronic obstructive pulmonary disease, unspecified: Secondary | ICD-10-CM | POA: Diagnosis not present

## 2023-11-22 LAB — COMPREHENSIVE METABOLIC PANEL WITH GFR
ALT: 24 U/L (ref 0–44)
AST: 37 U/L (ref 15–41)
Albumin: 3.2 g/dL — ABNORMAL LOW (ref 3.5–5.0)
Alkaline Phosphatase: 89 U/L (ref 38–126)
Anion gap: 9 (ref 5–15)
BUN: 15 mg/dL (ref 6–20)
CO2: 25 mmol/L (ref 22–32)
Calcium: 10.1 mg/dL (ref 8.9–10.3)
Chloride: 100 mmol/L (ref 98–111)
Creatinine, Ser: 0.82 mg/dL (ref 0.61–1.24)
GFR, Estimated: >60 mL/min (ref >60)
Glucose, Bld: 278 mg/dL — ABNORMAL HIGH (ref 70–99)
Potassium: 3.5 mmol/L (ref 3.5–5.1)
Sodium: 134 mmol/L — ABNORMAL LOW (ref 135–145)
Total Bilirubin: 1.7 mg/dL — ABNORMAL HIGH (ref 0.0–1.2)
Total Protein: 6.4 g/dL — ABNORMAL LOW (ref 6.5–8.1)

## 2023-11-22 LAB — CBC
HCT: 29.5 % — ABNORMAL LOW (ref 39.0–52.0)
Hemoglobin: 8.6 g/dL — ABNORMAL LOW (ref 13.0–17.0)
MCH: 18.9 pg — ABNORMAL LOW (ref 26.0–34.0)
MCHC: 29.2 g/dL — ABNORMAL LOW (ref 30.0–36.0)
MCV: 65 fL — ABNORMAL LOW (ref 80.0–100.0)
Platelets: 152 10*3/uL (ref 150–400)
RBC: 4.54 MIL/uL (ref 4.22–5.81)
RDW: 23.3 % — ABNORMAL HIGH (ref 11.5–15.5)
WBC: 8.2 10*3/uL (ref 4.0–10.5)
nRBC: 0 % (ref 0.0–0.2)

## 2023-11-22 LAB — LIPASE, BLOOD: Lipase: 52 U/L — ABNORMAL HIGH (ref 11–51)

## 2023-11-22 MED ORDER — ALUM & MAG HYDROXIDE-SIMETH 200-200-20 MG/5ML PO SUSP
30.0000 mL | Freq: Once | ORAL | Status: AC
  Administered 2023-11-22: 30 mL via ORAL
  Filled 2023-11-22: qty 30

## 2023-11-22 MED ORDER — ONDANSETRON HCL 4 MG/2ML IJ SOLN
4.0000 mg | Freq: Once | INTRAMUSCULAR | Status: AC
  Administered 2023-11-22: 4 mg via INTRAVENOUS
  Filled 2023-11-22: qty 2

## 2023-11-22 MED ORDER — PANTOPRAZOLE SODIUM 40 MG PO TBEC: 40.0000 mg | DELAYED_RELEASE_TABLET | Freq: Every day | ORAL | 1 refills | Status: DC

## 2023-11-22 MED ORDER — PANTOPRAZOLE SODIUM 40 MG IV SOLR
40.0000 mg | Freq: Once | INTRAVENOUS | Status: AC
  Administered 2023-11-22: 40 mg via INTRAVENOUS
  Filled 2023-11-22: qty 10

## 2023-11-22 MED ORDER — SUCRALFATE 1 G PO TABS: 1.0000 g | ORAL_TABLET | Freq: Four times a day (QID) | ORAL | 0 refills | Status: DC

## 2023-11-22 MED ORDER — LIDOCAINE VISCOUS HCL 2 % MT SOLN
15.0000 mL | Freq: Once | OROMUCOSAL | Status: AC
  Administered 2023-11-22: 15 mL via ORAL
  Filled 2023-11-22: qty 15

## 2023-11-22 MED ORDER — SODIUM CHLORIDE 0.9 % IV BOLUS
1000.0000 mL | Freq: Once | INTRAVENOUS | Status: AC
  Administered 2023-11-22: 1000 mL via INTRAVENOUS

## 2023-11-22 NOTE — ED Provider Notes (Signed)
 [facility] Provider Note    Event Date/Time   First MD Initiated Contact with Patient 11/22/23 [phone]     (approximate)  History   Chief Complaint: Upper abdominal pain  HPI  [name] is a 58 y.o. male with a past medical history of gastric reflux, COPD, peptic ulcers, presents to the emergency department for upper abdominal discomfort.  According to the patient over the last 3 to 4 days he has been experiencing upper abdominal discomfort.  Patient states a history of known peptic ulcers, states he has seen a GI doctor in the past had an endoscopy in the past for similar symptoms.  Patient states he has not had any symptoms for several months or longer however over the last 3 to 4 days he has begun experiencing upper abdominal pain and nausea.  States he has not been able to eat or drink much and has not been able to sleep due to the discomfort.  Currently rates the pain as a 5/10 nagging type pain.  Denies any diarrhea denies any fever.  Physical Exam   Triage Vital Signs: ED Triage Vitals  Encounter Vitals Group     BP 11/22/23 0844 (!) 166/82     Systolic BP Percentile --      Diastolic BP Percentile --      Pulse Rate 11/22/23 0844 65     Resp 11/22/23 0844 17     Temp 11/22/23 0844 97.9 F (36.6 C)     Temp Source 11/22/23 0844 Oral     SpO2 11/22/23 0844 100 %     Weight 11/22/23 0852 215 lb (97.5 kg)     Height 11/22/23 0852 6' (1.829 m)     Head Circumference --      Peak Flow --      Pain Score 11/22/23 0851 5     Pain Loc --      Pain Education --      Exclude from Growth Chart --     Most recent vital signs: Vitals:   11/22/23 0844  BP: (!) 166/82  Pulse: 65  Resp: 17  Temp: 97.9 F (36.6 C)  SpO2: 100%    General: Awake, no distress.  CV:  Good peripheral perfusion.  Regular rate and rhythm  Resp:  Normal effort.  Equal breath sounds bilaterally.  Abd:  No distention.  Soft, nontender.  Benign abdomen.  ED Results /  Procedures / Treatments   MEDICATIONS ORDERED IN ED: Medications  alum & mag hydroxide-simeth (MAALOX/MYLANTA) 200-200-20 MG/5ML suspension 30 mL (has no administration in time range)    And  lidocaine  (XYLOCAINE ) 2 % viscous mouth solution 15 mL (has no administration in time range)  pantoprazole  (PROTONIX ) injection 40 mg (has no administration in time range)  sodium chloride  0.9 % bolus 1,000 mL (has no administration in time range)  ondansetron  (ZOFRAN ) injection 4 mg (has no administration in time range)     IMPRESSION / MDM / ASSESSMENT AND PLAN / ED COURSE  I reviewed the triage vital signs and the nursing notes.  Patient's presentation is most consistent with acute presentation with potential threat to life or bodily function.  Patient presents to the emergency department for upper abdominal discomfort nausea ongoing over the last 3 to 4 days.  Patient states a history of peptic ulcer disease to which this feels identical.  We will check labs including LFTs and lipase as well as a troponin as a precaution.  We will treat with a GI cocktail as well as Protonix  and continue to closely monitor.  Patient's labs resulted showing a reassuring chemistry with normal LFTs.  Lipase slightly elevated at 52.  Patient CBC shows a normal white blood cell count but does show mild decrease in hemoglobin compared to baseline.  Rectal examination performed by myself shows light brown stool guaiac negative.  Does raise the possibility that the patient is having intermittent bleeding possibly from peptic ulcers.  Discussed with the patient the need to follow-up with a GI doctor.  He states after GI cocktail the pain is completely gone.  Highly suspect gastritis versus peptic ulcer disease.  Will place the patient on Protonix  1 tablet each morning for the next 2 months.  Will place the patient on sucralfate  to take before breakfast/lunch/dinner/bed for the next 2 weeks.  Patient will follow-up with GI  medicine.  Discussed return precautions.  Patient agreeable to plan of care.  FINAL CLINICAL IMPRESSION(S) / ED DIAGNOSES   epigastric pain   Note:  This document was prepared using Dragon voice recognition software and may include unintentional dictation errors.   Ruth Cove, MD 11/22/23 1100

## 2023-11-22 NOTE — Discharge Instructions (Signed)
 Please begin taking your Protonix  each morning for the next 2 months.  Please take sucralfate  as prescribed before breakfast/lunch/dinner/bedtime for the next 2 weeks.  Please avoid any alcohol, NSAIDs such as aspirin/Motrin/Aleve, or spicy foods for the next 1 month.  Please call the number provided for GI medicine to arrange a follow-up appointment soon as possible for further evaluation.  Return to the emergency department for any return of/worsening abdominal pain or any other symptom personally concerning to yourself.

## 2023-11-22 NOTE — ED Triage Notes (Signed)
 Pt c/o abd pain x 3 weeks. Pt states he has been unable to eat or sleep in 3 days due to the pain. Has a history of ulcers.

## 2024-01-19 ENCOUNTER — Other Ambulatory Visit: Payer: Self-pay

## 2024-01-19 ENCOUNTER — Emergency Department
Admission: EM | Admit: 2024-01-19 | Discharge: 2024-01-19 | Disposition: A | Discharge status: Home / Self Care | Attending: Emergency Medicine | Admitting: Emergency Medicine

## 2024-01-19 DIAGNOSIS — I10 Essential (primary) hypertension: Secondary | ICD-10-CM | POA: Insufficient documentation

## 2024-01-19 DIAGNOSIS — J449 Chronic obstructive pulmonary disease, unspecified: Secondary | ICD-10-CM | POA: Diagnosis not present

## 2024-01-19 DIAGNOSIS — K2900 Acute gastritis without bleeding: Secondary | ICD-10-CM | POA: Diagnosis not present

## 2024-01-19 DIAGNOSIS — R1013 Epigastric pain: Secondary | ICD-10-CM | POA: Diagnosis present

## 2024-01-19 LAB — COMPREHENSIVE METABOLIC PANEL WITH GFR
ALT: 20 U/L (ref 0–44)
AST: 32 U/L (ref 15–41)
Albumin: 3.5 g/dL (ref 3.5–5.0)
Alkaline Phosphatase: 105 U/L (ref 38–126)
Anion gap: 10 (ref 5–15)
BUN: 14 mg/dL (ref 6–20)
CO2: 24 mmol/L (ref 22–32)
Calcium: 10 mg/dL (ref 8.9–10.3)
Chloride: 100 mmol/L (ref 98–111)
Creatinine, Ser: 0.74 mg/dL (ref 0.61–1.24)
GFR, Estimated: >60 mL/min (ref >60)
Glucose, Bld: 384 mg/dL — ABNORMAL HIGH (ref 70–99)
Potassium: 3.4 mmol/L — ABNORMAL LOW (ref 3.5–5.1)
Sodium: 134 mmol/L — ABNORMAL LOW (ref 135–145)
Total Bilirubin: 1.7 mg/dL — ABNORMAL HIGH (ref 0.0–1.2)
Total Protein: 7.1 g/dL (ref 6.5–8.1)

## 2024-01-19 LAB — URINALYSIS, ROUTINE W REFLEX MICROSCOPIC
Bacteria, UA: NONE SEEN
Bilirubin Urine: NEGATIVE
Glucose, UA: >=500 mg/dL — AB
Hgb urine dipstick: NEGATIVE
Ketones, ur: NEGATIVE mg/dL
Leukocytes,Ua: NEGATIVE
Nitrite: NEGATIVE
Protein, ur: 100 mg/dL — AB
Specific Gravity, Urine: 1.018 (ref 1.005–1.030)
pH: 7 (ref 5.0–8.0)

## 2024-01-19 LAB — CBC
HCT: 27.4 % — ABNORMAL LOW (ref 39.0–52.0)
Hemoglobin: 8.3 g/dL — ABNORMAL LOW (ref 13.0–17.0)
MCH: 19.5 pg — ABNORMAL LOW (ref 26.0–34.0)
MCHC: 30.3 g/dL (ref 30.0–36.0)
MCV: 64.5 fL — ABNORMAL LOW (ref 80.0–100.0)
Platelets: 153 10*3/uL (ref 150–400)
RBC: 4.25 MIL/uL (ref 4.22–5.81)
RDW: 22.4 % — ABNORMAL HIGH (ref 11.5–15.5)
WBC: 7.8 10*3/uL (ref 4.0–10.5)
nRBC: 0 % (ref 0.0–0.2)

## 2024-01-19 LAB — LIPASE, BLOOD: Lipase: 70 U/L — ABNORMAL HIGH (ref 11–51)

## 2024-01-19 MED ORDER — SUCRALFATE 1 G PO TABS: 1.0000 g | ORAL_TABLET | Freq: Four times a day (QID) | ORAL | 0 refills | Status: AC

## 2024-01-19 MED ORDER — LIDOCAINE VISCOUS HCL 2 % MT SOLN
15.0000 mL | Freq: Once | OROMUCOSAL | Status: AC
  Administered 2024-01-19: 15 mL via ORAL
  Filled 2024-01-19: qty 15

## 2024-01-19 MED ORDER — PANTOPRAZOLE SODIUM 40 MG PO TBEC: 40.0000 mg | DELAYED_RELEASE_TABLET | Freq: Every day | ORAL | 1 refills | Status: AC

## 2024-01-19 MED ORDER — ALUM & MAG HYDROXIDE-SIMETH 200-200-20 MG/5ML PO SUSP
30.0000 mL | Freq: Once | ORAL | Status: AC
  Administered 2024-01-19: 30 mL via ORAL
  Filled 2024-01-19: qty 30

## 2024-01-19 NOTE — ED Provider Notes (Signed)
 [facility] Provider Note    Event Date/Time   First MD Initiated Contact with Patient 01/19/24 1104     (approximate)   History   Abdominal Pain   HPI  [name] is a 58 y.o. male with history of COPD, hypertension, gastritis who presents with complaints of epigastric discomfort.  Patient reports this feels similar to when he was seen here in April for PUD.  Does have a history of ulcer disease in the past.  He denies any bloody vomitus or dark stools.  He states this feels similar to April and is requesting the same medications that he was treated with then.     Physical Exam   Triage Vital Signs: ED Triage Vitals  Encounter Vitals Group     BP 01/19/24 1048 (!) 167/84     Girls Systolic BP Percentile --      Girls Diastolic BP Percentile --      Boys Systolic BP Percentile --      Boys Diastolic BP Percentile --      Pulse Rate 01/19/24 1048 68     Resp 01/19/24 1048 18     Temp 01/19/24 1048 98 F (36.7 C)     Temp src --      SpO2 01/19/24 1048 100 %     Weight 01/19/24 1046 90.7 kg (200 lb)     Height 01/19/24 1046 1.829 m (6')     Head Circumference --      Peak Flow --      Pain Score 01/19/24 1046 8     Pain Loc --      Pain Education --      Exclude from Growth Chart --     Most recent vital signs: Vitals:   01/19/24 1048  BP: (!) 167/84  Pulse: 68  Resp: 18  Temp: 98 F (36.7 C)  SpO2: 100%     General: Awake, no distress.  CV:  Good peripheral perfusion.  Resp:  Normal effort.  Abd:  No distention.  Soft, nontender, reassuring exam Other:     ED Results / Procedures / Treatments   Labs (all labs ordered are listed, but only abnormal results are displayed) Labs Reviewed  LIPASE, BLOOD - Abnormal; Notable for the following components:      Result Value   Lipase 70 (*)    All other components within normal limits  COMPREHENSIVE METABOLIC PANEL WITH GFR - Abnormal; Notable for the following components:    Sodium 134 (*)    Potassium 3.4 (*)    Glucose, Bld 384 (*)    Total Bilirubin 1.7 (*)    All other components within normal limits  CBC - Abnormal; Notable for the following components:   Hemoglobin 8.3 (*)    HCT 27.4 (*)    MCV 64.5 (*)    MCH 19.5 (*)    RDW 22.4 (*)    All other components within normal limits  URINALYSIS, ROUTINE W REFLEX MICROSCOPIC - Abnormal; Notable for the following components:   Color, Urine YELLOW (*)    APPearance CLEAR (*)    Glucose, UA >=500 (*)    Protein, ur 100 (*)    All other components within normal limits     EKG     RADIOLOGY     PROCEDURES:  Critical Care performed:   Procedures   MEDICATIONS ORDERED IN ED: Medications  alum & mag hydroxide-simeth (MAALOX/MYLANTA) 200-200-20 MG/5ML suspension 30 mL (  30 mLs Oral Given 01/19/24 1130)    And  lidocaine  (XYLOCAINE ) 2 % viscous mouth solution 15 mL (15 mLs Oral Given 01/19/24 1130)     IMPRESSION / MDM / ASSESSMENT AND PLAN / ED COURSE  I reviewed the triage vital signs and the nursing notes. Patient's presentation is most consistent with exacerbation of chronic illness.  Patient presents with epigastric pain as detailed above, patient has a history of PUD, gastritis, I suspect this is the cause of his symptoms today.  Will obtain labs and reevaluate.  Lipase is reassuring, treated with GI cocktail with significant improvement in discomfort, reviewed records from April 19, patient was treated with Protonix  and Carafate  with significant improvement.  We will prescribe these for him again.  No evidence of upper GI bleeding, strict return precautions, he agrees to this plan.        FINAL CLINICAL IMPRESSION(S) / ED DIAGNOSES   Final diagnoses:  Acute gastritis without hemorrhage, unspecified gastritis type     Rx / DC Orders   ED Discharge Orders          Ordered    pantoprazole  (PROTONIX ) 40 MG tablet  Daily        01/19/24 1145    sucralfate  (CARAFATE ) 1 g  tablet  4 times daily        01/19/24 1145             Note:  This document was prepared using Dragon voice recognition software and may include unintentional dictation errors.   Bryson Carbine, MD 01/19/24 [phone]

## 2024-01-19 NOTE — ED Triage Notes (Signed)
 Pt comes with c/o stomach ulcer. Pt stats he hasn't slept or ate in 3 days. Pt states nausea.
